# Patient Record
Sex: Male | Born: 1955
Health system: Southern US, Community
[De-identification: ages and names within clinical notes are randomized; demographics above are authoritative.]

## PROBLEM LIST (undated history)

## (undated) DIAGNOSIS — G4733 Obstructive sleep apnea (adult) (pediatric): Secondary | ICD-10-CM

## (undated) DIAGNOSIS — N200 Calculus of kidney: Secondary | ICD-10-CM

## (undated) DIAGNOSIS — Z9289 Personal history of other medical treatment: Secondary | ICD-10-CM

## (undated) DIAGNOSIS — G4730 Sleep apnea, unspecified: Secondary | ICD-10-CM

## (undated) DIAGNOSIS — M1990 Unspecified osteoarthritis, unspecified site: Secondary | ICD-10-CM

## (undated) DIAGNOSIS — C801 Malignant (primary) neoplasm, unspecified: Secondary | ICD-10-CM

## (undated) DIAGNOSIS — K219 Gastro-esophageal reflux disease without esophagitis: Secondary | ICD-10-CM

## (undated) DIAGNOSIS — Z6841 Body Mass Index (BMI) 40.0 and over, adult: Secondary | ICD-10-CM

## (undated) DIAGNOSIS — E785 Hyperlipidemia, unspecified: Secondary | ICD-10-CM

## (undated) DIAGNOSIS — E1169 Type 2 diabetes mellitus with other specified complication: Secondary | ICD-10-CM

## (undated) DIAGNOSIS — I10 Essential (primary) hypertension: Secondary | ICD-10-CM

## (undated) HISTORY — PX: FRACTURE SURGERY: SHX138

## (undated) HISTORY — PX: JOINT REPLACEMENT: SHX530

## (undated) HISTORY — PX: TONSILLECTOMY: SUR1361

## (undated) HISTORY — DX: Morbid (severe) obesity due to excess calories: E66.01

## (undated) HISTORY — DX: Hyperlipidemia, unspecified: E78.5

## (undated) HISTORY — DX: Obstructive sleep apnea (adult) (pediatric): G47.33

## (undated) HISTORY — DX: Type 2 diabetes mellitus with other specified complication: E11.69

## (undated) HISTORY — DX: Body Mass Index (BMI) 40.0 and over, adult: Z684

## (undated) HISTORY — DX: Essential (primary) hypertension: I10

---

## 1998-02-15 ENCOUNTER — Other Ambulatory Visit: Admission: RE | Admit: 1998-02-15 | Discharge: 1998-02-15 | Payer: Self-pay | Admitting: Family Medicine

## 1998-04-18 ENCOUNTER — Other Ambulatory Visit: Admission: RE | Admit: 1998-04-18 | Discharge: 1998-04-18 | Payer: Self-pay | Admitting: Family Medicine

## 2006-09-03 ENCOUNTER — Emergency Department: Payer: Self-pay | Admitting: Emergency Medicine

## 2006-09-04 ENCOUNTER — Ambulatory Visit: Payer: Self-pay | Admitting: Urology

## 2006-09-10 ENCOUNTER — Ambulatory Visit: Payer: Self-pay | Admitting: Urology

## 2006-09-16 ENCOUNTER — Ambulatory Visit: Payer: Self-pay | Admitting: Urology

## 2006-09-29 ENCOUNTER — Ambulatory Visit: Payer: Self-pay

## 2007-01-05 ENCOUNTER — Ambulatory Visit: Payer: Self-pay | Admitting: Urology

## 2008-09-07 ENCOUNTER — Ambulatory Visit (HOSPITAL_COMMUNITY): Admission: RE | Admit: 2008-09-07 | Discharge: 2008-09-07 | Payer: Self-pay | Admitting: Specialist

## 2011-05-26 ENCOUNTER — Emergency Department: Payer: Self-pay | Admitting: *Deleted

## 2011-05-29 ENCOUNTER — Ambulatory Visit: Payer: Self-pay

## 2011-06-06 ENCOUNTER — Inpatient Hospital Stay (HOSPITAL_COMMUNITY): Payer: Worker's Compensation

## 2011-06-06 ENCOUNTER — Inpatient Hospital Stay (HOSPITAL_COMMUNITY)
Admission: RE | Admit: 2011-06-06 | Discharge: 2011-06-08 | DRG: 484 | Disposition: A | Discharge status: Home Health | Payer: Worker's Compensation | Source: Ambulatory Visit | Attending: Orthopedic Surgery | Admitting: Orthopedic Surgery

## 2011-06-06 DIAGNOSIS — R0789 Other chest pain: Secondary | ICD-10-CM | POA: Diagnosis not present

## 2011-06-06 DIAGNOSIS — Z7982 Long term (current) use of aspirin: Secondary | ICD-10-CM

## 2011-06-06 DIAGNOSIS — G4733 Obstructive sleep apnea (adult) (pediatric): Secondary | ICD-10-CM | POA: Diagnosis present

## 2011-06-06 DIAGNOSIS — Z886 Allergy status to analgesic agent status: Secondary | ICD-10-CM

## 2011-06-06 DIAGNOSIS — I10 Essential (primary) hypertension: Secondary | ICD-10-CM | POA: Diagnosis present

## 2011-06-06 DIAGNOSIS — S42209A Unspecified fracture of upper end of unspecified humerus, initial encounter for closed fracture: Principal | ICD-10-CM | POA: Diagnosis present

## 2011-06-06 DIAGNOSIS — Z880 Allergy status to penicillin: Secondary | ICD-10-CM

## 2011-06-06 DIAGNOSIS — E119 Type 2 diabetes mellitus without complications: Secondary | ICD-10-CM | POA: Diagnosis present

## 2011-06-06 LAB — SURGICAL PCR SCREEN
MRSA, PCR: NEGATIVE
Staphylococcus aureus: NEGATIVE

## 2011-06-06 LAB — BASIC METABOLIC PANEL
Calcium: 10.2 mg/dL (ref 8.4–10.5)
Creatinine, Ser: 1.29 mg/dL (ref 0.50–1.35)
Glucose, Bld: 179 mg/dL — ABNORMAL HIGH (ref 70–99)
Potassium: 4.8 mEq/L (ref 3.5–5.1)
Sodium: 134 mEq/L — ABNORMAL LOW (ref 135–145)

## 2011-06-06 LAB — URINALYSIS, ROUTINE W REFLEX MICROSCOPIC
Glucose, UA: 100 mg/dL — AB
Leukocytes, UA: NEGATIVE
Nitrite: NEGATIVE
Urobilinogen, UA: 1 mg/dL (ref 0.0–1.0)

## 2011-06-06 LAB — CBC
HCT: 35.4 % — ABNORMAL LOW (ref 39.0–52.0)
Hemoglobin: 12.3 g/dL — ABNORMAL LOW (ref 13.0–17.0)
MCH: 31.4 pg (ref 26.0–34.0)
MCHC: 34.7 g/dL (ref 30.0–36.0)
Platelets: 349 10*3/uL (ref 150–400)
RDW: 13.6 % (ref 11.5–15.5)
WBC: 6.8 10*3/uL (ref 4.0–10.5)

## 2011-06-06 LAB — GLUCOSE, CAPILLARY
Glucose-Capillary: 181 mg/dL — ABNORMAL HIGH (ref 70–99)
Glucose-Capillary: 184 mg/dL — ABNORMAL HIGH (ref 70–99)
Glucose-Capillary: 238 mg/dL — ABNORMAL HIGH (ref 70–99)

## 2011-06-06 LAB — PROTIME-INR: INR: 1 (ref 0.00–1.49)

## 2011-06-06 LAB — DIFFERENTIAL
Eosinophils Absolute: 0.1 10*3/uL (ref 0.0–0.7)
Lymphocytes Relative: 23 % (ref 12–46)

## 2011-06-07 ENCOUNTER — Inpatient Hospital Stay (HOSPITAL_COMMUNITY): Payer: Worker's Compensation

## 2011-06-07 ENCOUNTER — Encounter (HOSPITAL_COMMUNITY): Payer: Self-pay | Admitting: Radiology

## 2011-06-07 LAB — DIFFERENTIAL
Lymphocytes Relative: 7 % — ABNORMAL LOW (ref 12–46)
Lymphs Abs: 0.7 10*3/uL (ref 0.7–4.0)
Monocytes Relative: 6 % (ref 3–12)
Neutro Abs: 9.2 10*3/uL — ABNORMAL HIGH (ref 1.7–7.7)

## 2011-06-07 LAB — GLUCOSE, CAPILLARY
Glucose-Capillary: 265 mg/dL — ABNORMAL HIGH (ref 70–99)
Glucose-Capillary: 259 mg/dL — ABNORMAL HIGH (ref 70–99)

## 2011-06-07 LAB — COMPREHENSIVE METABOLIC PANEL
ALT: 26 U/L (ref 0–53)
AST: 25 U/L (ref 0–37)
Alkaline Phosphatase: 76 U/L (ref 39–117)
Calcium: 9.3 mg/dL (ref 8.4–10.5)
Chloride: 98 mEq/L (ref 96–112)
Creatinine, Ser: 1.3 mg/dL (ref 0.50–1.35)
GFR calc Af Amer: >60 mL/min (ref >60)
GFR calc non Af Amer: 57 mL/min — ABNORMAL LOW (ref >60)
Glucose, Bld: 248 mg/dL — ABNORMAL HIGH (ref 70–99)
Sodium: 132 mEq/L — ABNORMAL LOW (ref 135–145)

## 2011-06-07 LAB — CBC
Hemoglobin: 11.2 g/dL — ABNORMAL LOW (ref 13.0–17.0)
MCH: 31.3 pg (ref 26.0–34.0)
Platelets: 323 10*3/uL (ref 150–400)
RBC: 3.58 MIL/uL — ABNORMAL LOW (ref 4.22–5.81)
RDW: 13.4 % (ref 11.5–15.5)

## 2011-06-07 LAB — CARDIAC PANEL(CRET KIN+CKTOT+MB+TROPI)
CK, MB: 5.1 ng/mL — ABNORMAL HIGH (ref 0.3–4.0)
CK, MB: 4.1 ng/mL — ABNORMAL HIGH (ref 0.3–4.0)
CK, MB: 3.1 ng/mL (ref 0.3–4.0)
Relative Index: 0.6 (ref 0.0–2.5)
Relative Index: 0.6 (ref 0.0–2.5)
Troponin I: <0.3 ng/mL (ref <0.30)
Troponin I: <0.3 ng/mL (ref <0.30)

## 2011-06-07 LAB — D-DIMER, QUANTITATIVE: D-Dimer, Quant: 3.79 ug/mL-FEU — ABNORMAL HIGH (ref 0.00–0.48)

## 2011-06-07 MED ORDER — IOHEXOL 350 MG/ML SOLN: 100.0000 mL | Freq: Once | INTRAVENOUS | Status: AC | PRN
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
## 2011-07-05 ENCOUNTER — Emergency Department (HOSPITAL_COMMUNITY)
Admission: EM | Admit: 2011-07-05 | Discharge: 2011-07-05 | Disposition: A | Discharge status: Home / Self Care | Payer: Worker's Compensation | Attending: Emergency Medicine | Admitting: Emergency Medicine

## 2011-07-05 ENCOUNTER — Emergency Department (HOSPITAL_COMMUNITY): Payer: Worker's Compensation

## 2011-07-05 DIAGNOSIS — K5900 Constipation, unspecified: Secondary | ICD-10-CM | POA: Insufficient documentation

## 2011-07-05 DIAGNOSIS — R109 Unspecified abdominal pain: Secondary | ICD-10-CM | POA: Insufficient documentation

## 2011-07-05 DIAGNOSIS — R141 Gas pain: Secondary | ICD-10-CM | POA: Insufficient documentation

## 2011-07-05 DIAGNOSIS — R142 Eructation: Secondary | ICD-10-CM | POA: Insufficient documentation

## 2011-07-05 DIAGNOSIS — I10 Essential (primary) hypertension: Secondary | ICD-10-CM | POA: Insufficient documentation

## 2011-07-05 DIAGNOSIS — E119 Type 2 diabetes mellitus without complications: Secondary | ICD-10-CM | POA: Insufficient documentation

## 2011-07-05 DIAGNOSIS — Z79899 Other long term (current) drug therapy: Secondary | ICD-10-CM | POA: Insufficient documentation

## 2011-07-05 DIAGNOSIS — M129 Arthropathy, unspecified: Secondary | ICD-10-CM | POA: Insufficient documentation

## 2011-07-05 DIAGNOSIS — E7800 Pure hypercholesterolemia, unspecified: Secondary | ICD-10-CM | POA: Insufficient documentation

## 2011-07-05 LAB — URINALYSIS, ROUTINE W REFLEX MICROSCOPIC
Bilirubin Urine: NEGATIVE
Glucose, UA: 100 mg/dL — AB
Hgb urine dipstick: NEGATIVE
Ketones, ur: NEGATIVE mg/dL
Protein, ur: NEGATIVE mg/dL
pH: 6.5 (ref 5.0–8.0)

## 2012-03-27 ENCOUNTER — Encounter (HOSPITAL_COMMUNITY): Payer: Self-pay

## 2012-03-27 ENCOUNTER — Encounter (HOSPITAL_COMMUNITY): Payer: Self-pay | Admitting: Pharmacist

## 2012-03-27 NOTE — Progress Notes (Signed)
Multiple R wrist nerve blocks, last one 01/2012

## 2012-03-27 NOTE — Progress Notes (Signed)
[facility]

## 2012-03-27 NOTE — Pre-Procedure Instructions (Signed)
20 [name]  03/27/2012   Your procedure is scheduled on:  04/03/2012  Report to [facility] at 5:30 AM.  Call this number if you have problems the morning of surgery: [phone]   Remember:   Do not eat food:After Midnight.  04/02/2012  May have clear liquids: up to 4 Hours before arrival.  NOTHING AFTER 1:30 a.m.   Clear liquids include soda, tea, black coffee, apple or grape juice, broth.  Take these medicines the morning of surgery with A SIP OF WATER: NEXIUM, NEURONTIN   Do not wear jewelry, make-up or nail polish.  Do not wear lotions, powders, or perfumes. You may wear deodorant.  Do not shave 48 hours prior to surgery. Men may shave face and neck.  Do not bring valuables to the hospital.  Contacts, dentures or bridgework may not be worn into surgery.  Leave suitcase in the car. After surgery it may be brought to your room.  For patients admitted to the hospital, checkout time is 11:00 AM the day of discharge.   Patients discharged the day of surgery will not be allowed to drive home.  Name and phone number of your driver: /w spouse  Special Instructions: CHG Shower Use Special Wash: 1/2 bottle night before surgery and 1/2 bottle morning of surgery.   Please read over the following fact sheets that you were given: Pain Booklet, Coughing and Deep Breathing, MRSA Information and Surgical Site Infection Prevention

## 2012-03-30 ENCOUNTER — Encounter (HOSPITAL_COMMUNITY)
Admission: RE | Admit: 2012-03-30 | Discharge: 2012-03-30 | Disposition: A | Discharge status: Home / Self Care | Payer: Worker's Compensation | Source: Ambulatory Visit | Attending: Orthopedic Surgery | Admitting: Orthopedic Surgery

## 2012-03-30 LAB — BASIC METABOLIC PANEL
BUN: 17 mg/dL (ref 6–23)
Chloride: 101 mEq/L (ref 96–112)
GFR calc Af Amer: 72 mL/min — ABNORMAL LOW (ref >90)
Glucose, Bld: 103 mg/dL — ABNORMAL HIGH (ref 70–99)
Potassium: 3.8 mEq/L (ref 3.5–5.1)
Sodium: 140 mEq/L (ref 135–145)

## 2012-03-30 LAB — CBC
HCT: 39.5 % (ref 39.0–52.0)
Hemoglobin: 13.4 g/dL (ref 13.0–17.0)
MCH: 30.7 pg (ref 26.0–34.0)
MCHC: 33.9 g/dL (ref 30.0–36.0)
RBC: 4.36 MIL/uL (ref 4.22–5.81)

## 2012-03-30 LAB — SURGICAL PCR SCREEN: Staphylococcus aureus: NEGATIVE

## 2012-04-02 MED ORDER — CHLORHEXIDINE GLUCONATE 4 % EX LIQD: 60.0000 mL | Freq: Once | CUTANEOUS | Status: DC

## 2012-04-02 MED ORDER — VANCOMYCIN HCL 1000 MG IV SOLR
1500.0000 mg | INTRAVENOUS | Status: AC
  Administered 2012-04-03: 1500 mg via INTRAVENOUS
  Filled 2012-04-02: qty 1500

## 2012-04-02 NOTE — H&P (Signed)
CC: left shoulder pain and stiffness HPI: 55 y/o male with hx of left proximal humerus fracture requiring a hemi arthroplasty having worsening pain and diminished rom due to scar tissue. Pt has elected for open removal of scar tissue to improve function PMH: diabetes, hypertension, sleep apnea, GERD, kidney stones Social: non smoker, non drinker, no illicit drugs Allergies: norco, penicillin, adhesive Meds: amitriptyline, aspirin, nexium, antara, gabapentin, hctz, insulin, vitamins, zocor, altace, actoplus metformin Ros: limited rom left shoulder s/p fracture PE: alert and appropriate 55 y/o male in no acute distress Cervical spine: full rom, cranial nerves 2-12 intact Left shoulder: moderate restriction in regards to rom nv intact distally Strength 4.5/5 as compared to right with ER and IR Chest: active breath sounds bilaterally with no wheeze rhonchi or rales Heart: regular rate rhythm X-rays: s/p left shoulder hemi arthroplasty in good position and placement Assessment: left shoulder stiffness s/p fracture and hemi arthroplasty Plan: open scar release to increase function 

## 2012-04-03 ENCOUNTER — Inpatient Hospital Stay (HOSPITAL_COMMUNITY)
Admission: RE | Admit: 2012-04-03 | Discharge: 2012-04-04 | DRG: 507 | Disposition: A | Discharge status: Home Health | Payer: Worker's Compensation | Source: Ambulatory Visit | Attending: Orthopedic Surgery | Admitting: Orthopedic Surgery

## 2012-04-03 ENCOUNTER — Encounter (HOSPITAL_COMMUNITY): Payer: Self-pay | Admitting: *Deleted

## 2012-04-03 ENCOUNTER — Encounter (HOSPITAL_COMMUNITY): Payer: Self-pay | Admitting: Anesthesiology

## 2012-04-03 ENCOUNTER — Ambulatory Visit (HOSPITAL_COMMUNITY): Payer: Worker's Compensation | Admitting: Anesthesiology

## 2012-04-03 ENCOUNTER — Encounter (HOSPITAL_COMMUNITY)
Admission: RE | Disposition: A | Discharge status: Home Health | Payer: Self-pay | Source: Ambulatory Visit | Attending: Orthopedic Surgery

## 2012-04-03 DIAGNOSIS — T84099A Other mechanical complication of unspecified internal joint prosthesis, initial encounter: Secondary | ICD-10-CM | POA: Diagnosis present

## 2012-04-03 DIAGNOSIS — E109 Type 1 diabetes mellitus without complications: Secondary | ICD-10-CM | POA: Diagnosis present

## 2012-04-03 DIAGNOSIS — B999 Unspecified infectious disease: Secondary | ICD-10-CM | POA: Diagnosis present

## 2012-04-03 DIAGNOSIS — IMO0002 Reserved for concepts with insufficient information to code with codable children: Secondary | ICD-10-CM | POA: Diagnosis present

## 2012-04-03 DIAGNOSIS — M7500 Adhesive capsulitis of unspecified shoulder: Secondary | ICD-10-CM | POA: Diagnosis present

## 2012-04-03 DIAGNOSIS — Y831 Surgical operation with implant of artificial internal device as the cause of abnormal reaction of the patient, or of later complication, without mention of misadventure at the time of the procedure: Secondary | ICD-10-CM | POA: Diagnosis present

## 2012-04-03 DIAGNOSIS — M25512 Pain in left shoulder: Secondary | ICD-10-CM | POA: Diagnosis present

## 2012-04-03 DIAGNOSIS — Z880 Allergy status to penicillin: Secondary | ICD-10-CM

## 2012-04-03 DIAGNOSIS — K219 Gastro-esophageal reflux disease without esophagitis: Secondary | ICD-10-CM | POA: Diagnosis present

## 2012-04-03 DIAGNOSIS — Z96619 Presence of unspecified artificial shoulder joint: Secondary | ICD-10-CM

## 2012-04-03 DIAGNOSIS — T8450XA Infection and inflammatory reaction due to unspecified internal joint prosthesis, initial encounter: Principal | ICD-10-CM | POA: Diagnosis present

## 2012-04-03 DIAGNOSIS — A498 Other bacterial infections of unspecified site: Secondary | ICD-10-CM | POA: Diagnosis present

## 2012-04-03 DIAGNOSIS — Z794 Long term (current) use of insulin: Secondary | ICD-10-CM

## 2012-04-03 DIAGNOSIS — I10 Essential (primary) hypertension: Secondary | ICD-10-CM | POA: Diagnosis present

## 2012-04-03 DIAGNOSIS — Y92009 Unspecified place in unspecified non-institutional (private) residence as the place of occurrence of the external cause: Secondary | ICD-10-CM

## 2012-04-03 HISTORY — DX: Gastro-esophageal reflux disease without esophagitis: K21.9

## 2012-04-03 HISTORY — DX: Sleep apnea, unspecified: G47.30

## 2012-04-03 HISTORY — DX: Unspecified osteoarthritis, unspecified site: M19.90

## 2012-04-03 HISTORY — DX: Calculus of kidney: N20.0

## 2012-04-03 LAB — GLUCOSE, CAPILLARY
Glucose-Capillary: 166 mg/dL — ABNORMAL HIGH (ref 70–99)
Glucose-Capillary: 133 mg/dL — ABNORMAL HIGH (ref 70–99)
Glucose-Capillary: 189 mg/dL — ABNORMAL HIGH (ref 70–99)

## 2012-04-03 LAB — GRAM STAIN

## 2012-04-03 SURGERY — IRRIGATION AND DEBRIDEMENT SHOULDER
Anesthesia: General | Site: Shoulder | Laterality: Left | Wound class: Dirty or Infected

## 2012-04-03 MED ORDER — PIOGLITAZONE HCL 15 MG PO TABS
15.0000 mg | ORAL_TABLET | Freq: Two times a day (BID) | ORAL | Status: DC
  Administered 2012-04-03 – 2012-04-04 (×2): 15 mg via ORAL
  Filled 2012-04-03 (×4): qty 1

## 2012-04-03 MED ORDER — ACETAMINOPHEN 650 MG RE SUPP: 650.0000 mg | Freq: Four times a day (QID) | RECTAL | Status: DC | PRN

## 2012-04-03 MED ORDER — SODIUM CHLORIDE 0.9 % IR SOLN
Status: DC | PRN
  Administered 2012-04-03: 1000 mL

## 2012-04-03 MED ORDER — DOCUSATE SODIUM 100 MG PO CAPS
100.0000 mg | ORAL_CAPSULE | Freq: Two times a day (BID) | ORAL | Status: DC
  Administered 2012-04-03 – 2012-04-04 (×2): 100 mg via ORAL
  Filled 2012-04-03 (×3): qty 1

## 2012-04-03 MED ORDER — INSULIN DETEMIR 100 UNIT/ML ~~LOC~~ SOLN: 50.0000 [IU] | Freq: Every day | SUBCUTANEOUS | Status: DC

## 2012-04-03 MED ORDER — METFORMIN HCL 850 MG PO TABS
850.0000 mg | ORAL_TABLET | Freq: Two times a day (BID) | ORAL | Status: DC
  Administered 2012-04-03 – 2012-04-04 (×2): 850 mg via ORAL
  Filled 2012-04-03 (×4): qty 1

## 2012-04-03 MED ORDER — SODIUM CHLORIDE 0.9 % IV SOLN
INTRAVENOUS | Status: DC | PRN
  Administered 2012-04-03: 07:00:00 via INTRAVENOUS

## 2012-04-03 MED ORDER — ONDANSETRON HCL 4 MG/2ML IJ SOLN: 4.0000 mg | Freq: Four times a day (QID) | INTRAMUSCULAR | Status: DC | PRN

## 2012-04-03 MED ORDER — ONDANSETRON HCL 4 MG/2ML IJ SOLN
INTRAMUSCULAR | Status: DC | PRN
  Administered 2012-04-03: 4 mg via INTRAVENOUS

## 2012-04-03 MED ORDER — ACETAMINOPHEN 325 MG PO TABS: 650.0000 mg | ORAL_TABLET | Freq: Four times a day (QID) | ORAL | Status: DC | PRN

## 2012-04-03 MED ORDER — HYDROCHLOROTHIAZIDE 12.5 MG PO CAPS
12.5000 mg | ORAL_CAPSULE | Freq: Every day | ORAL | Status: DC
  Administered 2012-04-03 – 2012-04-04 (×2): 12.5 mg via ORAL
  Filled 2012-04-03 (×2): qty 1

## 2012-04-03 MED ORDER — PANTOPRAZOLE SODIUM 40 MG PO TBEC
40.0000 mg | DELAYED_RELEASE_TABLET | Freq: Every day | ORAL | Status: DC
  Administered 2012-04-04: 40 mg via ORAL
  Filled 2012-04-03: qty 1

## 2012-04-03 MED ORDER — OXYCODONE HCL 5 MG PO TABS
5.0000 mg | ORAL_TABLET | ORAL | Status: DC | PRN
  Administered 2012-04-03: 10 mg via ORAL
  Filled 2012-04-03 (×2): qty 2

## 2012-04-03 MED ORDER — KETOROLAC TROMETHAMINE 30 MG/ML IJ SOLN
INTRAMUSCULAR | Status: DC | PRN
  Administered 2012-04-03: 30 mg via INTRAVENOUS

## 2012-04-03 MED ORDER — ROCURONIUM BROMIDE 100 MG/10ML IV SOLN
INTRAVENOUS | Status: DC | PRN
  Administered 2012-04-03: 50 mg via INTRAVENOUS
  Administered 2012-04-03: 10 mg via INTRAVENOUS

## 2012-04-03 MED ORDER — METOCLOPRAMIDE HCL 10 MG PO TABS: 5.0000 mg | ORAL_TABLET | Freq: Three times a day (TID) | ORAL | Status: DC | PRN

## 2012-04-03 MED ORDER — METHOCARBAMOL 100 MG/ML IJ SOLN
500.0000 mg | Freq: Four times a day (QID) | INTRAVENOUS | Status: DC | PRN
  Filled 2012-04-03: qty 5

## 2012-04-03 MED ORDER — ACETAMINOPHEN 10 MG/ML IV SOLN
INTRAVENOUS | Status: AC
  Filled 2012-04-03: qty 100

## 2012-04-03 MED ORDER — METHOCARBAMOL 500 MG PO TABS: 500.0000 mg | ORAL_TABLET | Freq: Four times a day (QID) | ORAL | Status: DC | PRN

## 2012-04-03 MED ORDER — PIOGLITAZONE HCL-METFORMIN HCL 15-850 MG PO TABS
1.0000 | ORAL_TABLET | Freq: Two times a day (BID) | ORAL | Status: DC
Start: 2012-04-03 — End: 2012-04-03

## 2012-04-03 MED ORDER — HYDROMORPHONE HCL PF 1 MG/ML IJ SOLN: 0.2500 mg | INTRAMUSCULAR | Status: DC | PRN

## 2012-04-03 MED ORDER — LACTATED RINGERS IV SOLN
INTRAVENOUS | Status: DC | PRN
  Administered 2012-04-03 (×2): via INTRAVENOUS

## 2012-04-03 MED ORDER — INSULIN DETEMIR 100 UNIT/ML ~~LOC~~ SOLN
50.0000 [IU] | Freq: Every day | SUBCUTANEOUS | Status: DC
  Administered 2012-04-04: 50 [IU] via SUBCUTANEOUS
  Filled 2012-04-03: qty 10

## 2012-04-03 MED ORDER — VITAMIN C 500 MG PO TABS
500.0000 mg | ORAL_TABLET | Freq: Two times a day (BID) | ORAL | Status: DC
  Administered 2012-04-03 – 2012-04-04 (×2): 500 mg via ORAL
  Filled 2012-04-03 (×3): qty 1

## 2012-04-03 MED ORDER — METOCLOPRAMIDE HCL 5 MG/ML IJ SOLN: 5.0000 mg | Freq: Three times a day (TID) | INTRAMUSCULAR | Status: DC | PRN

## 2012-04-03 MED ORDER — PHENOL 1.4 % MT LIQD: 1.0000 | OROMUCOSAL | Status: DC | PRN

## 2012-04-03 MED ORDER — ONDANSETRON HCL 4 MG/2ML IJ SOLN: 4.0000 mg | Freq: Once | INTRAMUSCULAR | Status: DC | PRN

## 2012-04-03 MED ORDER — VANCOMYCIN HCL IN DEXTROSE 1-5 GM/200ML-% IV SOLN
1000.0000 mg | Freq: Two times a day (BID) | INTRAVENOUS | Status: AC
  Administered 2012-04-03: 1000 mg via INTRAVENOUS
  Filled 2012-04-03: qty 200

## 2012-04-03 MED ORDER — BISACODYL 10 MG RE SUPP: 10.0000 mg | Freq: Every day | RECTAL | Status: DC | PRN

## 2012-04-03 MED ORDER — FENTANYL CITRATE 0.05 MG/ML IJ SOLN
INTRAMUSCULAR | Status: DC | PRN
  Administered 2012-04-03: 50 ug via INTRAVENOUS
  Administered 2012-04-03: 100 ug via INTRAVENOUS
  Administered 2012-04-03: 75 ug via INTRAVENOUS

## 2012-04-03 MED ORDER — NEOSTIGMINE METHYLSULFATE 1 MG/ML IJ SOLN
INTRAMUSCULAR | Status: DC | PRN
  Administered 2012-04-03: 5 mg via INTRAVENOUS

## 2012-04-03 MED ORDER — ROPIVACAINE HCL 5 MG/ML IJ SOLN
INTRAMUSCULAR | Status: DC | PRN
  Administered 2012-04-03: 30 mL via EPIDURAL

## 2012-04-03 MED ORDER — PROPOFOL 10 MG/ML IV EMUL
INTRAVENOUS | Status: DC | PRN
  Administered 2012-04-03: 100 mg via INTRAVENOUS
  Administered 2012-04-03: 200 mg via INTRAVENOUS

## 2012-04-03 MED ORDER — ACETAMINOPHEN 10 MG/ML IV SOLN
INTRAVENOUS | Status: DC | PRN
  Administered 2012-04-03: 1000 mg via INTRAVENOUS

## 2012-04-03 MED ORDER — SENNOSIDES-DOCUSATE SODIUM 8.6-50 MG PO TABS
1.0000 | ORAL_TABLET | Freq: Two times a day (BID) | ORAL | Status: DC
Start: 2012-04-03 — End: 2012-04-04
  Administered 2012-04-03 – 2012-04-04 (×2): 1 via ORAL
  Filled 2012-04-03 (×2): qty 1

## 2012-04-03 MED ORDER — FENOFIBRATE 160 MG PO TABS
160.0000 mg | ORAL_TABLET | Freq: Every day | ORAL | Status: DC
  Administered 2012-04-04: 160 mg via ORAL
  Filled 2012-04-03 (×2): qty 1

## 2012-04-03 MED ORDER — MENTHOL 3 MG MT LOZG: 1.0000 | LOZENGE | OROMUCOSAL | Status: DC | PRN

## 2012-04-03 MED ORDER — ONDANSETRON HCL 4 MG PO TABS: 4.0000 mg | ORAL_TABLET | Freq: Four times a day (QID) | ORAL | Status: DC | PRN

## 2012-04-03 MED ORDER — MIDAZOLAM HCL 5 MG/5ML IJ SOLN
INTRAMUSCULAR | Status: DC | PRN
  Administered 2012-04-03: 2 mg via INTRAVENOUS

## 2012-04-03 MED ORDER — RAMIPRIL 10 MG PO CAPS
10.0000 mg | ORAL_CAPSULE | Freq: Every day | ORAL | Status: DC
  Administered 2012-04-04: 10 mg via ORAL
  Filled 2012-04-03 (×2): qty 1

## 2012-04-03 MED ORDER — VITAMIN B-6 100 MG PO TABS
100.0000 mg | ORAL_TABLET | Freq: Two times a day (BID) | ORAL | Status: DC
  Administered 2012-04-03 – 2012-04-04 (×2): 100 mg via ORAL
  Filled 2012-04-03 (×3): qty 1

## 2012-04-03 MED ORDER — HYDROCHLOROTHIAZIDE 25 MG PO TABS: 12.5000 mg | ORAL_TABLET | Freq: Every day | ORAL | Status: DC

## 2012-04-03 MED ORDER — HYDROMORPHONE HCL PF 1 MG/ML IJ SOLN
0.5000 mg | INTRAMUSCULAR | Status: DC | PRN
  Administered 2012-04-04 (×2): 1 mg via INTRAVENOUS
  Filled 2012-04-03 (×2): qty 1

## 2012-04-03 MED ORDER — AMITRIPTYLINE HCL 25 MG PO TABS
25.0000 mg | ORAL_TABLET | Freq: Every day | ORAL | Status: DC
  Administered 2012-04-03: 25 mg via ORAL
  Filled 2012-04-03 (×2): qty 1

## 2012-04-03 MED ORDER — INSULIN ASPART 100 UNIT/ML ~~LOC~~ SOLN
4.0000 [IU] | Freq: Three times a day (TID) | SUBCUTANEOUS | Status: DC
  Administered 2012-04-03 – 2012-04-04 (×3): 4 [IU] via SUBCUTANEOUS

## 2012-04-03 MED ORDER — SIMVASTATIN 40 MG PO TABS
40.0000 mg | ORAL_TABLET | Freq: Every day | ORAL | Status: DC
  Administered 2012-04-03: 40 mg via ORAL
  Filled 2012-04-03 (×2): qty 1

## 2012-04-03 MED ORDER — INSULIN ASPART 100 UNIT/ML ~~LOC~~ SOLN
0.0000 [IU] | Freq: Three times a day (TID) | SUBCUTANEOUS | Status: DC
  Administered 2012-04-03: 2 [IU] via SUBCUTANEOUS
  Administered 2012-04-04: 3 [IU] via SUBCUTANEOUS
  Administered 2012-04-04: 5 [IU] via SUBCUTANEOUS

## 2012-04-03 MED ORDER — LIDOCAINE HCL (CARDIAC) 20 MG/ML IV SOLN
INTRAVENOUS | Status: DC | PRN
  Administered 2012-04-03: 100 mg via INTRAVENOUS

## 2012-04-03 MED ORDER — GLYCOPYRROLATE 0.2 MG/ML IJ SOLN
INTRAMUSCULAR | Status: DC | PRN
  Administered 2012-04-03: .8 mg via INTRAVENOUS

## 2012-04-03 MED ORDER — SODIUM CHLORIDE 0.9 % IV SOLN
INTRAVENOUS | Status: DC
  Administered 2012-04-03 – 2012-04-04 (×2): via INTRAVENOUS

## 2012-04-03 MED ORDER — ASPIRIN EC 81 MG PO TBEC
81.0000 mg | DELAYED_RELEASE_TABLET | Freq: Every day | ORAL | Status: DC
  Administered 2012-04-03 – 2012-04-04 (×2): 81 mg via ORAL
  Filled 2012-04-03 (×2): qty 1

## 2012-04-03 MED ORDER — GABAPENTIN 300 MG PO CAPS
300.0000 mg | ORAL_CAPSULE | Freq: Three times a day (TID) | ORAL | Status: DC
  Administered 2012-04-03 – 2012-04-04 (×3): 300 mg via ORAL
  Filled 2012-04-03 (×5): qty 1

## 2012-04-03 SURGICAL SUPPLY — 48 items
CLOTH BEACON ORANGE TIMEOUT ST (SAFETY) ×2 IMPLANT
CLSR STERI-STRIP ANTIMIC 1/2X4 (GAUZE/BANDAGES/DRESSINGS) ×2 IMPLANT
COVER SURGICAL LIGHT HANDLE (MISCELLANEOUS) ×2 IMPLANT
DRAPE INCISE IOBAN 66X45 STRL (DRAPES) ×2 IMPLANT
DRAPE U-SHAPE 47X51 STRL (DRAPES) ×2 IMPLANT
DRSG PAD ABDOMINAL 8X10 ST (GAUZE/BANDAGES/DRESSINGS) IMPLANT
DURAPREP 26ML APPLICATOR (WOUND CARE) ×2 IMPLANT
DURAPREP 6ML APPLICATOR 50/CS (WOUND CARE) ×2 IMPLANT
ELECT REM PT RETURN 9FT ADLT (ELECTROSURGICAL)
ELECTRODE REM PT RTRN 9FT ADLT (ELECTROSURGICAL) IMPLANT
EVACUATOR 1/8 PVC DRAIN (DRAIN) IMPLANT
GLOVE BIOGEL PI ORTHO PRO 7.5 (GLOVE) ×1
GLOVE BIOGEL PI ORTHO PRO SZ8 (GLOVE) ×1
GLOVE ORTHO TXT STRL SZ7.5 (GLOVE) ×2 IMPLANT
GLOVE PI ORTHO PRO STRL 7.5 (GLOVE) ×1 IMPLANT
GLOVE PI ORTHO PRO STRL SZ8 (GLOVE) ×1 IMPLANT
GLOVE SURG ORTHO 8.0 STRL STRW (GLOVE) ×2 IMPLANT
GLOVE SURG ORTHO 8.5 STRL (GLOVE) ×4 IMPLANT
GOWN STRL NON-REIN LRG LVL3 (GOWN DISPOSABLE) IMPLANT
GOWN STRL REIN XL XLG (GOWN DISPOSABLE) ×6 IMPLANT
HANDPIECE INTERPULSE COAX TIP (DISPOSABLE) ×2
KIT BASIN OR (CUSTOM PROCEDURE TRAY) ×2 IMPLANT
KIT ROOM TURNOVER OR (KITS) ×2 IMPLANT
MANIFOLD NEPTUNE II (INSTRUMENTS) ×2 IMPLANT
NS IRRIG 1000ML POUR BTL (IV SOLUTION) ×2 IMPLANT
PACK SHOULDER (CUSTOM PROCEDURE TRAY) ×2 IMPLANT
PAD ARMBOARD 7.5X6 YLW CONV (MISCELLANEOUS) ×4 IMPLANT
SET HNDPC FAN SPRY TIP SCT (DISPOSABLE) ×1 IMPLANT
SPONGE GAUZE 4X4 12PLY (GAUZE/BANDAGES/DRESSINGS) IMPLANT
SPONGE LAP 18X18 X RAY DECT (DISPOSABLE) IMPLANT
SUT FIBERWIRE #2 38 T-5 BLUE (SUTURE)
SUT MNCRL AB 3-0 PS2 18 (SUTURE) ×2 IMPLANT
SUT PDS AB 1 CT  36 (SUTURE) ×1
SUT PDS AB 1 CT 36 (SUTURE) ×1 IMPLANT
SUT VIC AB 0 CT1 27 (SUTURE)
SUT VIC AB 0 CT1 27XBRD ANBCTR (SUTURE) IMPLANT
SUT VIC AB 2-0 CT1 27 (SUTURE) ×4
SUT VIC AB 2-0 CT1 TAPERPNT 27 (SUTURE) ×2 IMPLANT
SUTURE FIBERWR #2 38 T-5 BLUE (SUTURE) IMPLANT
SWAB COLLECTION DEVICE MRSA (MISCELLANEOUS) ×2 IMPLANT
TAPE PAPER 3X10 WHT MICROPORE (GAUZE/BANDAGES/DRESSINGS) ×2 IMPLANT
TOWEL OR 17X24 6PK STRL BLUE (TOWEL DISPOSABLE) ×2 IMPLANT
TOWEL OR 17X26 10 PK STRL BLUE (TOWEL DISPOSABLE) ×2 IMPLANT
TUBE ANAEROBIC SPECIMEN COL (MISCELLANEOUS) ×2 IMPLANT
TUBE CONNECTING 12X1/4 (SUCTIONS) IMPLANT
UNDERPAD 30X30 INCONTINENT (UNDERPADS AND DIAPERS) IMPLANT
WATER STERILE IRR 1000ML POUR (IV SOLUTION) IMPLANT
YANKAUER SUCT BULB TIP NO VENT (SUCTIONS) IMPLANT

## 2012-04-03 NOTE — Anesthesia Procedure Notes (Addendum)
Anesthesia Regional Block:  Interscalene brachial plexus block  Pre-Anesthetic Checklist: ,, timeout performed, Correct Patient, Correct Site, Correct Laterality, Correct Procedure, Correct Position, site marked, Risks and benefits discussed,  Surgical consent,  Pre-op evaluation,  At surgeon's request and post-op pain management  Laterality: Left  Prep: chloraprep       Needles:  Injection technique: Single-shot  Needle Type: Echogenic Stimulator Needle     Needle Length: 5cm 5 cm     Additional Needles:  Procedures: ultrasound guided and nerve stimulator Interscalene brachial plexus block  Nerve Stimulator or Paresthesia:  Response: 0.4 mA,   Additional Responses:   Narrative:  Start time: 04/03/2012 7:10 AM End time: 04/03/2012 7:25 AM Injection made incrementally with aspirations every 5 mL.  Performed by: Personally  Anesthesiologist: Arta Bruce MD  Additional Notes: Monitors applied. Patient sedated. Sterile prep and drape,hand hygiene and sterile gloves were used. Relevant anatomy identified.Needle position confirmed.Local anesthetic injected incrementally after negative aspiration. Local anesthetic spread visualized around nerve(s). Vascular puncture avoided. No complications. Image printed for medical record.The patient tolerated the procedure well.       Interscalene brachial plexus block Procedure Name: Intubation Date/Time: 04/03/2012 8:06 AM Performed by: Cathie Olden B Pre-anesthesia Checklist: Patient identified, Emergency Drugs available, Suction available, Patient being monitored and Timeout performed Patient Re-evaluated:Patient Re-evaluated prior to inductionOxygen Delivery Method: Circle system utilized Preoxygenation: Pre-oxygenation with 100% oxygen Intubation Type: IV induction Ventilation: Mask ventilation without difficulty Tube type: Oral Tube size: 7.5 mm Number of attempts: 2 Airway Equipment and Method: Stylet and  Video-laryngoscopy Placement Confirmation: ETT inserted through vocal cords under direct vision,  breath sounds checked- equal and bilateral,  positive ETCO2 and CO2 detector Secured at: 23 cm Tube secured with: Tape Dental Injury: Teeth and Oropharynx as per pre-operative assessment  Difficulty Due To: Difficulty was anticipated, Difficult Airway- due to reduced neck mobility and Difficult Airway- due to limited oral opening

## 2012-04-03 NOTE — Anesthesia Postprocedure Evaluation (Signed)
  Anesthesia Post-op Note  Patient: [name]  Procedure(s) Performed: Procedure(s) (LRB): IRRIGATION AND DEBRIDEMENT SHOULDER (Left)  Patient Location: PACU  Anesthesia Type: General  Level of Consciousness: awake  Airway and Oxygen Therapy: Patient Spontanous Breathing  Post-op Pain: mild  Post-op Assessment: Post-op Vital signs reviewed  Post-op Vital Signs: Reviewed  Complications: No apparent anesthesia complications

## 2012-04-03 NOTE — Preoperative (Signed)
Beta Blockers   Reason not to administer Beta Blockers:Not Applicable 

## 2012-04-03 NOTE — Transfer of Care (Signed)
Immediate Anesthesia Transfer of Care Note  Patient: [name]  Procedure(s) Performed: Procedure(s) (LRB): IRRIGATION AND DEBRIDEMENT SHOULDER (Left)  Patient Location: PACU  Anesthesia Type: Houston  Level of Consciousness: awake  Airway & Oxygen Therapy: Patient Spontanous Breathing  Post-op Assessment: Report given to PACU RN and Post -op Vital signs reviewed and stable  Post vital signs: Reviewed and stable  Complications: No apparent anesthesia complications

## 2012-04-03 NOTE — Discharge Instructions (Signed)
Please exercise at least every hour.  Ice shoulder as much as possible. Keep incision clean and dry for 5 days, then shower.  Follow up in 2 weeks.  [phone]

## 2012-04-03 NOTE — Interval H&P Note (Signed)
History and Physical Interval Note:  04/03/2012 7:33 AM  [name]  has presented today for surgery, with the diagnosis of LEFT SHOULDER STIFFNESS, STATUS POST HEMI-ARTROPLASTY  The various methods of treatment have been discussed with the patient and family. After consideration of risks, benefits and other options for treatment, the patient has consented to  Procedure(s) (LRB): IRRIGATION AND DEBRIDEMENT SHOULDER (Left) as a surgical intervention .  The patients' history has been reviewed, patient examined, no change in status, stable for surgery.  I have reviewed the patients' chart and labs.  Questions were answered to the patient's satisfaction.     Jniyah Dantuono,STEVEN R

## 2012-04-03 NOTE — Anesthesia Preprocedure Evaluation (Addendum)
Anesthesia Evaluation  Patient identified by MRN, date of birth, ID band Patient awake    Reviewed: Allergy & Precautions, H&P , NPO status , Patient's Chart, lab work & pertinent test results  Airway Mallampati: III TM Distance: <3 FB Neck ROM: Limited  Mouth opening: Limited Mouth Opening  Dental  (+) Teeth Intact and Dental Advisory Given   Pulmonary shortness of breath and with exertion, sleep apnea and Continuous Positive Airway Pressure Ventilation ,          Cardiovascular hypertension, Pt. on medications     Neuro/Psych    GI/Hepatic GERD-  Controlled and Medicated,  Endo/Other  Diabetes mellitus-, Well Controlled, Type 1, Insulin Dependent  Renal/GU      Musculoskeletal   Abdominal   Peds  Hematology   Anesthesia Other Findings   Reproductive/Obstetrics                         Anesthesia Physical Anesthesia Plan  ASA: III  Anesthesia Plan: General   Post-op Pain Management: MAC Combined w/ Regional for Post-op pain   Induction: Intravenous  Airway Management Planned: Oral ETT  Additional Equipment:   Intra-op Plan:   Post-operative Plan: Extubation in OR  Informed Consent: I have reviewed the patients History and Physical, chart, labs and discussed the procedure including the risks, benefits and alternatives for the proposed anesthesia with the patient or authorized representative who has indicated his/her understanding and acceptance.   Dental advisory given  Plan Discussed with: Surgeon and CRNA  Anesthesia Plan Comments:        Anesthesia Quick Evaluation

## 2012-04-03 NOTE — Brief Op Note (Signed)
04/03/2012  9:46 AM  PATIENT:  [name]  56 y.o. male  PRE-OPERATIVE DIAGNOSIS:  LEFT SHOULDER STIFFNESS, STATUS POST HEMI-ARTHROPLASTY  POST-OPERATIVE DIAGNOSIS:  LEFT SHOULDER STIFFNESS, STATUS POST HEMI-ARTHROPLASTY, POSSIBLE INFECTION, TUBEROSITY MALUNION  PROCEDURE:  Procedure(s) (LRB): IRRIGATION AND DEBRIDEMENT SHOULDER (Left), SCAR TISSUE TAKEDOWN, INTRA-OPERATIVE CULTURES  SURGEON:  Surgeon(s) and Role:    * Verlee Rossetti, MD - Primary  PHYSICIAN ASSISTANT:   ASSISTANTS: Thea Gist, PA-C   ANESTHESIA:   regional and general  EBL:  Total I/O In: 1200 [I.V.:1200] Out: -   BLOOD ADMINISTERED:none  DRAINS: none   LOCAL MEDICATIONS USED:  NONE  SPECIMEN:  Fluid, superficial   Fluid, deep DISPOSITION OF SPECIMEN:  Micro  COUNTS:  YES  TOURNIQUET:  * No tourniquets in log *  DICTATION: .Other Dictation: Dictation Number [id number]  PLAN OF CARE: Admit to inpatient   PATIENT DISPOSITION:  PACU - hemodynamically stable.   Delay start of Pharmacological VTE agent (>24hrs) due to surgical blood loss or risk of bleeding: not applicable

## 2012-04-04 LAB — BASIC METABOLIC PANEL
BUN: 20 mg/dL (ref 6–23)
CO2: 23 mEq/L (ref 19–32)
Chloride: 101 mEq/L (ref 96–112)
Creatinine, Ser: 1.43 mg/dL — ABNORMAL HIGH (ref 0.50–1.35)
Glucose, Bld: 188 mg/dL — ABNORMAL HIGH (ref 70–99)
Potassium: 3.6 mEq/L (ref 3.5–5.1)

## 2012-04-04 LAB — GLUCOSE, CAPILLARY
Glucose-Capillary: 194 mg/dL — ABNORMAL HIGH (ref 70–99)
Glucose-Capillary: 207 mg/dL — ABNORMAL HIGH (ref 70–99)

## 2012-04-04 NOTE — Op Note (Signed)
NAME:  [name], [name] NO.:  [id number]  MEDICAL RECORD NO.:  [id number]  LOCATION:  [facility]                         FACILITY:  [facility]  PHYSICIAN:  [name], M.D. DATE OF BIRTH:  12-28-1955  DATE OF PROCEDURE:  04/03/2012 DATE OF DISCHARGE:                              OPERATIVE REPORT   PREOPERATIVE DIAGNOSIS:  Left shoulder stiffness following hemiarthroplasty for fracture.  POSTOPERATIVE DIAGNOSES: 1. Left shoulder stiffness following hemiarthroplasty for fracture. 2. Left shoulder potential deep infection. 3. Left tuberosity malunion.  PROCEDURE PERFORMED:  Left shoulder exam under anesthesia, open lysis of adhesions, open operative cultures, and open I and D.  SURGEON:  [name], M.D.  ASSISTANT:  [name]. Dixon, PA-C, was scrubbed the entire procedure and necessary for satisfactory completion of surgery.  ANESTHESIA:  General anesthesia was used plus interscalene block.  ESTIMATED BLOOD LOSS:  Minimal.  FLUID REPLACEMENT:  1200 mL crystalloid.  COUNTS:  Correct.  COMPLICATIONS:  No complications.  Perioperative antibiotics were given.  INDICATIONS:  The patient is a 56 year old male with a history of left shoulder fracture from a work-injury.  The patient was treated with initial left shoulder hemiarthroplasty for his fractured shoulder.  The patient went on to successful tuberosity union;  however, the patient's shoulder function never recovered.  He has had persistent pain and limited function with the shoulder.  The patient clinically in the office has extensive scar tissue about the shoulder, which we felt was limiting his ability to use his shoulder and potentially causing his pain.  We discussed options with the patient including continued conservative treatment versus surgery and elected to proceed with surgical management in an attempt to eliminate his pain and restore function of the shoulder, and this was consistent  with open scar release, the patient agreed, consent was signed.  DESCRIPTION OF PROCEDURE:  After adequate level of anesthesia was achieved, the patient was positioned in the modified beach-chair position and exam under anesthesia was performed, revealing a limited range of motion.  He had external rotation limited to 0, internal rotation 30 degrees, not even to his abdomen, forward elevation 45 degrees, abduction about 30 degrees.  We went ahead and then sterilely prepped and draped the shoulder and arm in the usual manner.  Time-out had been called.  We entered the shoulder by using the patient's prior deltopectoral incision, dissection down through subcutaneous tissues. We identified a subcutaneous fluid collection that had cloudy fluid in it, it was about a centimeter round, and we then sent that tissue culture for aerobic, anaerobic, and stat Gram stain.  This came back in Surgery as few monocytes and no organisms.  We next went ahead and irrigated that fully with a liter of pulsatile irrigation.  The remaining tissue looked normal.  We then found the deltopectoral interval and mobilized the deltoid and the pectoralis off the deeper layers.  We identified as we got up near the rotator interval and actually entered in the joint, a little bit more cloudy fluid, which was sent for deep culture that was not back by the time surgery was done. We then went ahead and freed up the subscapularis as best we could  from the undersurface of the coracoid, from the underside of the conjoined tendon.  We were careful to protect the axillary and musculocutaneous nerves.  We freed up the entire subdeltoid plane.  It appeared that the patient had tuberosity malunion with the subscap and lesser tuberosity healing inferiorly and the right greater tuberosity rotator cuff healing posteriorly definitely not in the anatomic position.  The stem was still in proper position, but did not have bone up around  the tuberosity locations where we would like to have it near the pins, but it had healed more posteriorly and down a little bit more inferiorly on the shaft.  We took out all extraneous suture material given the finding of potential pus in the shoulder, so all the FiberWire suture we removed, tuberosities were debulked trying to decrease the bulkiness of those underneath the deltoid.  We then went ahead and made a final assessment. We did some gentle manipulation just being careful not to break the humerus, but tried to get the best motion we could.  We get forward elevation of about 90 degrees, external rotation 45, internal rotation easily to the abdomen, definite improvement, but not a home-run.  I was just concerned about trying to do anymore around the tuberosities for fear of that and destabilizing the rotator cuff or subscap and giving him a poor function.  We thoroughly irrigated with 2 more liters of pulsatile irrigation.  We then closed the deltopectoral interval with #1 PDS suture, followed by 2-0 Vicryl for subcutaneous closure, and 4-0 Monocryl for skin.  Steri-Strips were applied, followed by a sterile dressing.  The patient tolerated the surgery well.     [name], M.D.     SRN/MEDQ  D:  04/03/2012  T:  04/03/2012  Job:  [id number]

## 2012-04-04 NOTE — Progress Notes (Signed)
Subjective: 1 Day Post-Op Procedure(s) (LRB): IRRIGATION AND DEBRIDEMENT SHOULDER (Left) Patient reports pain as 3 on 0-10 scale.    Objective: Vital signs in last 24 hours: Temp:  [96.8 F (36 C)-100.1 F (37.8 C)] 99.3 F (37.4 C) (06/08 0600) Pulse Rate:  [75-110] 95  (06/08 0600) Resp:  [16-34] 18  (06/08 0600) BP: (123-154)/(67-86) 144/82 mmHg (06/08 0600) SpO2:  [91 %-100 %] 91 % (06/08 0600)  Intake/Output from previous day: 06/07 0701 - 06/08 0700 In: 2580 [P.O.:480; I.V.:2100] Out: 150 [Urine:150] Intake/Output this shift:     Basename 04/04/12 0530  HGB 11.5*    Basename 04/04/12 0530  WBC --  RBC --  HCT 34.4*  PLT --    Basename 04/04/12 0530  NA 137  K 3.6  CL 101  CO2 23  BUN 20  CREATININE 1.43*  GLUCOSE 188*  CALCIUM 8.6   No results found for this basename: LABPT:2,INR:2 in the last 72 hours  Neurologically intact Intact pulses distally  Assessment/Plan: 1 Day Post-Op Procedure(s) (LRB): IRRIGATION AND DEBRIDEMENT SHOULDER (Left) D/C IV fluids Discharge home with home health  Juliyah Mergen III,Tyreese Thain L 04/04/2012, 9:52 AM

## 2012-04-04 NOTE — Evaluation (Signed)
Occupational Therapy Evaluation and Discharge  Patient Details Name: [name] MRN: [id number] DOB: 1956/10/02 Today's Date: 04/04/2012 Time: 7425-9563 OT Time Calculation (min): 30 min  OT Assessment / Plan / Recommendation Clinical Impression  Pt. presents s/p IRRIGATION AND DEBRIDEMENT SHOULDER (Left) and with increased pain. All education completed with pt. and pt's wife will provide assist at home. Will defer further OT to [facility] services and allow for D/C home today from therapy perspective.    OT Assessment  All further OT needs can be met in the next venue of care    Follow Up Recommendations  Home health OT;Supervision - Intermittent       Equipment Recommendations  None recommended by OT          Precautions / Restrictions Precautions Precautions: Shoulder Type of Shoulder Precautions: Shoulder ROM to pain tolerance Precaution Booklet Issued: Yes (comment) Restrictions Weight Bearing Restrictions: Yes LUE Weight Bearing: Non weight bearing       ADL  Eating/Feeding: Simulated;Set up Where Assessed - Eating/Feeding: Chair Grooming: Performed;Wash/dry hands;Wash/dry face;Teeth care;Modified independent Where Assessed - Grooming: Unsupported standing Upper Body Bathing: Simulated;Minimal assistance Where Assessed - Upper Body Bathing: Unsupported sitting Lower Body Bathing: Simulated;Minimal assistance Where Assessed - Lower Body Bathing: Unsupported sit to stand Upper Body Dressing: Performed;Moderate assistance (don gown) Where Assessed - Upper Body Dressing: Unsupported sitting Lower Body Dressing: Simulated;Moderate assistance Where Assessed - Lower Body Dressing: Unsupported sit to stand Toilet Transfer: Performed;Modified independent Toilet Transfer Method: Other (comment) (standing) Toilet Transfer Equipment: Regular height toilet Toileting - Clothing Manipulation and Hygiene: Performed;Independent Where Assessed - Toileting Clothing Manipulation and  Hygiene: Standing Tub/Shower Transfer Method: Not assessed Equipment Used: Other (comment) (sling) Transfers/Ambulation Related to ADLs: Pt. mod I ~ 10'  ADL Comments: Pt. recalls exercises and techniques for ADLs from prior surgery. Pt. demonstrated understanding in completing ADL tasks and exercises      OT Problem List: Decreased range of motion;Decreased activity tolerance;Impaired UE functional use     Visit Information  Last OT Received On: 04/04/12 Assistance Needed: +1    Subjective Data  Subjective: "I remember this from last time" Patient Stated Goal: "Go home today"   Prior Functioning  Home Living Lives With: Spouse Available Help at Discharge: Family Type of Home: House Home Access: Level entry Home Layout: One level Bathroom Shower/Tub: Engineer, manufacturing systems: Standard Home Adaptive Equipment: None Prior Function Level of Independence: Independent Able to Take Stairs?: Yes Driving: Yes Vocation: On disability Dominant Hand: Right (ambidexterous)       Extremity/Trunk Assessment Right Upper Extremity Assessment RUE ROM/Strength/Tone: Within functional levels RUE Sensation: WFL - Light Touch RUE Coordination: WFL - gross/fine motor Left Upper Extremity Assessment LUE ROM/Strength/Tone: Deficits;Due to pain;Due to precautions LUE Sensation: WFL - Light Touch LUE Coordination: WFL - fine motor   Mobility Bed Mobility Bed Mobility: Supine to Sit Supine to Sit: 6: Modified independent (Device/Increase time);With rails Details for Bed Mobility Assistance: Increased time Transfers Transfers: Sit to Stand;Stand to Sit Sit to Stand: 6: Modified independent (Device/Increase time);With upper extremity assist;From bed Stand to Sit: 6: Modified independent (Device/Increase time);To chair/3-in-1 Details for Transfer Assistance: Increased time and use of sling         End of Session OT - End of Session Equipment Utilized During Treatment: Gait  belt;Other (comment) (sling) Activity Tolerance: Patient tolerated treatment well Patient left: in chair;with call bell/phone within reach Nurse Communication: Mobility status;Patient requests pain meds   Cassandria Anger, OTR/L Pager [phone] 04/04/2012,  1:30 PM

## 2012-04-04 NOTE — Progress Notes (Signed)
Patient ID: [name], male   DOB: [birth year], 56 y.o.   MRN: [id number] Pt. Discharged 04/04/2012  11:27 AM Discharge instructions reviewed with patient/family. Patient/family verbalized understanding. All Rx's given. Questions answered as needed. Pt. Discharged to home with family/self.  Lurline Idol [facility]

## 2012-04-05 LAB — WOUND CULTURE: Culture: NO GROWTH

## 2012-04-06 LAB — WOUND CULTURE

## 2012-04-07 NOTE — Progress Notes (Signed)
Utilization review completed. Dierre Crevier, RN, BSN. 04/07/12  

## 2012-04-21 ENCOUNTER — Encounter (HOSPITAL_COMMUNITY)
Admission: RE | Admit: 2012-04-21 | Discharge: 2012-04-21 | Disposition: A | Discharge status: Home / Self Care | Payer: Worker's Compensation | Source: Ambulatory Visit | Attending: Orthopedic Surgery | Admitting: Orthopedic Surgery

## 2012-04-21 ENCOUNTER — Encounter (HOSPITAL_COMMUNITY): Payer: Self-pay | Admitting: Pharmacy Technician

## 2012-04-21 ENCOUNTER — Encounter (HOSPITAL_COMMUNITY): Payer: Self-pay

## 2012-04-21 LAB — BASIC METABOLIC PANEL
BUN: 21 mg/dL (ref 6–23)
Creatinine, Ser: 1.23 mg/dL (ref 0.50–1.35)
GFR calc Af Amer: 73 mL/min — ABNORMAL LOW (ref >90)
GFR calc non Af Amer: 63 mL/min — ABNORMAL LOW (ref >90)

## 2012-04-21 LAB — CBC
HCT: 38.5 % — ABNORMAL LOW (ref 39.0–52.0)
MCHC: 34 g/dL (ref 30.0–36.0)
MCV: 91.9 fL (ref 78.0–100.0)
RDW: 14.2 % (ref 11.5–15.5)

## 2012-04-21 NOTE — Pre-Procedure Instructions (Signed)
20 [name]  04/21/2012   Your procedure is scheduled on:  04-24-2012  Report to [facility] at 9:00 AM.  Call this number if you have problems the morning of surgery: [phone]   Remember:   Do not eat food or drink:After Midnight.     Take these medicines the morning of surgery with A SIP OF WATER: Cipro, gabapentin(Neurotin),   Do not wear jewelry, make-up or nail polish.  Do not wear lotions, powders, or perfumes. You may wear deodorant.  Do not shave 48 hours prior to surgery. Men may shave face and neck.  Do not bring valuables to the hospital.  Contacts, dentures or bridgework may not be worn into surgery.  Leave suitcase in the car. After surgery it may be brought to your room.  For patients admitted to the hospital, checkout time is 11:00 AM the day of discharge.      Special Instructions: Incentive Spirometry - Practice and bring it with you on the day of surgery. and CHG Shower Use Special Wash: 1/2 bottle night before surgery and 1/2 bottle morning of surgery.   Please read over the following fact sheets that you were given: Pain Booklet, Coughing and Deep Breathing, MRSA Information and Surgical Site Infection Prevention

## 2012-04-22 NOTE — Consult Note (Addendum)
Anesthesia Chart Review:  Patient is a 56 year old male scheduled for left shoulder removal of implant and reinsert antibiotic spacer by Dr. Ranell Patrick on 04/24/12.  History includes obesity with BMI 35, DM2, HTN, GERD, non-smoker, OSA, arthritis, kidney stones.  He is s/p left shoulder exam under anesthesia with LOA and I&D on 04/03/12 and left shoulder hemiarthroplasty on 06/06/11.  CXR on 06/06/11 showed no active cardiopulmonary disease.  Labs noted.  K 5.1, Cr 1.23, glucose 136.  H/H 13.1/38.5.   EKG on 06/06/11 showed ST, inferior Q waves (new in aVF) from the EKG taken earlier that day.  This was s/p his left shoulder hemiarthroplasty due to complaints of left shoulder and chest pain.  Medicine was consulted and by notes they also reviewed his EKG with Cardiologist Dr. Freida Busman.  A formal Cardiology consult was not felt warranted though as he ruled out for MI by cycled enzymes.  CTA of the chest was also done and ruled out PE.    Although there were some changes in patient's post-operative EKG, he ruled out for MI at that time.  He did not report any chest pain symptoms to his PAT nurse. He has since tolerated another left shoulder procedure (on 04/03/12).  Anticipate he can proceed if he remains asymptomatic.  Anesthesiologist Dr. Chaney Malling agrees with plan.  It has been nearly a year since his previous EKG, so will repeat it on arrival to evaluate for stability.    Shonna Chock, PA-C

## 2012-04-23 MED ORDER — CHLORHEXIDINE GLUCONATE 4 % EX LIQD: 60.0000 mL | Freq: Once | CUTANEOUS | Status: DC

## 2012-04-23 MED ORDER — VANCOMYCIN HCL 1000 MG IV SOLR
1500.0000 mg | INTRAVENOUS | Status: AC
  Administered 2012-04-24: 1.5 g via INTRAVENOUS
  Filled 2012-04-23: qty 1500

## 2012-04-23 NOTE — Discharge Summary (Signed)
Physician Discharge Summary  Patient ID: [name] MRN: [id number] DOB/AGE: 1956/03/19 56 y.o.  Admit date: 04/03/2012 Discharge date: 04/05/2012  Admission Diagnoses:  Left shoulder pain with swelling  Discharge Diagnoses:  E.Coli infection left shoulder   Surgeries: Procedure(s): IRRIGATION AND DEBRIDEMENT SHOULDER on 04/03/2012   Consultants: PT/OT  Discharged Condition: Stable  Hospital Course: [name] is an 56 y.o. male who was admitted 04/03/2012 with a chief complaint of No chief complaint on file. , and found to have a diagnosis of Shoulder pain, left.  They were brought to the operating room on 04/03/2012 and underwent the above named procedures.    The patient had an uncomplicated hospital course and was stable for discharge.  Recent vital signs:  Filed Vitals:   04/04/12 0600  BP: 144/82  Pulse: 95  Temp: 99.3 F (37.4 C)  Resp: 18    Recent laboratory studies:  Results for orders placed during the hospital encounter of 04/03/12  BASIC METABOLIC PANEL      Component Value Range   Sodium 140  135 - 145 mEq/L   Potassium 3.8  3.5 - 5.1 mEq/L   Chloride 101  96 - 112 mEq/L   CO2 23  19 - 32 mEq/L   Glucose, Bld 103 (*) 70 - 99 mg/dL   BUN 17  6 - 23 mg/dL   Creatinine, Ser 5.62  0.50 - 1.35 mg/dL   Calcium 9.9  8.4 - 13.0 mg/dL   GFR calc non Af Amer 62 (*) >90 mL/min   GFR calc Af Amer 72 (*) >90 mL/min  CBC      Component Value Range   WBC 7.4  4.0 - 10.5 K/uL   RBC 4.36  4.22 - 5.81 MIL/uL   Hemoglobin 13.4  13.0 - 17.0 g/dL   HCT 86.5  78.4 - 69.6 %   MCV 90.6  78.0 - 100.0 fL   MCH 30.7  26.0 - 34.0 pg   MCHC 33.9  30.0 - 36.0 g/dL   RDW 29.5  28.4 - 13.2 %   Platelets 251  150 - 400 K/uL  SURGICAL PCR SCREEN      Component Value Range   MRSA, PCR NEGATIVE  NEGATIVE   Staphylococcus aureus NEGATIVE  NEGATIVE  GLUCOSE, CAPILLARY      Component Value Range   Glucose-Capillary 166 (*) 70 - 99 mg/dL  GRAM STAIN      Component Value  Range   Specimen Description WOUND LEFT SHOULDER     Special Requests PATIENT ON FOLLOWING VANCOMYCIN     Gram Stain       Value: RARE WBC PRESENT, PREDOMINANTLY MONONUCLEAR     NO ORGANISMS SEEN     CALLED TO OR 04/03/12 0900 BY K SCHULTZ   Report Status 04/03/2012 FINAL    ANAEROBIC CULTURE      Component Value Range   Specimen Description WOUND LEFT SHOULDER     Special Requests PATIENT ON FOLLOWING VANCOMYCIN     Gram Stain       Value: RARE WBC PRESENT, PREDOMINANTLY MONONUCLEAR     NO ORGANISMS SEEN     Performed at [facility]   Culture NO ANAEROBES ISOLATED     Report Status 04/08/2012 FINAL    WOUND CULTURE      Component Value Range   Specimen Description WOUND LEFT SHOULDER     Special Requests PATIENT ON FOLLOWING VANCOMYCIN     Gram Stain  Value: RARE WBC PRESENT, PREDOMINANTLY MONONUCLEAR     NO ORGANISMS SEEN     Performed at [facility] Gram Stain Report Called to,Read Back By and Verified With: Gram Stain Report Called to,Read Back By and Verified With: OR 04/03/12 0900 BY K SCHULTZ   Culture NO GROWTH 2 DAYS     Report Status 04/05/2012 FINAL    ANAEROBIC CULTURE      Component Value Range   Specimen Description WOUND SHOULDER LEFT     Special Requests LEFT SHOULDER DEEP WOUND     Gram Stain       Value: RARE WBC PRESENT,BOTH PMN AND MONONUCLEAR     NO ORGANISMS SEEN     Performed at [facility]   Culture NO ANAEROBES ISOLATED     Report Status 04/08/2012 FINAL    WOUND CULTURE      Component Value Range   Specimen Description WOUND SHOULDER LEFT     Special Requests LEFT SHOULDER DEEP WOUND     Gram Stain       Value: RARE WBC PRESENT,BOTH PMN AND MONONUCLEAR     NO ORGANISMS SEEN     Gram Stain Report Called to,Read Back By and Verified With: Gram Stain Report Called to,Read Back By and Verified With: OR 04/03/2012 0925 BY K SCHULTZ Performed at [facility]   Culture FEW ESCHERICHIA COLI     Report Status 04/06/2012  FINAL     Organism ID, Bacteria ESCHERICHIA COLI    GRAM STAIN      Component Value Range   Specimen Description WOUND SHOULDER LEFT     Special Requests LEFT SHOULDER DEEP WOUND     Gram Stain       Value: RARE WBC PRESENT,BOTH PMN AND MONONUCLEAR     NO ORGANISMS SEEN     CALLED TO OR 04/03/12 0925 BY K SCHULTZ   Report Status 04/03/2012 FINAL    GLUCOSE, CAPILLARY      Component Value Range   Glucose-Capillary 121 (*) 70 - 99 mg/dL   Comment 1 Documented in Chart     Comment 2 Notify RN    GLUCOSE, CAPILLARY      Component Value Range   Glucose-Capillary 133 (*) 70 - 99 mg/dL   Comment 1 Documented in Chart     Comment 2 Notify RN    GLUCOSE, CAPILLARY      Component Value Range   Glucose-Capillary 107 (*) 70 - 99 mg/dL   Comment 1 Documented in Chart     Comment 2 Notify RN    BASIC METABOLIC PANEL      Component Value Range   Sodium 137  135 - 145 mEq/L   Potassium 3.6  3.5 - 5.1 mEq/L   Chloride 101  96 - 112 mEq/L   CO2 23  19 - 32 mEq/L   Glucose, Bld 188 (*) 70 - 99 mg/dL   BUN 20  6 - 23 mg/dL   Creatinine, Ser 9.60 (*) 0.50 - 1.35 mg/dL   Calcium 8.6  8.4 - 45.4 mg/dL   GFR calc non Af Amer 53 (*) >90 mL/min   GFR calc Af Amer 61 (*) >90 mL/min  HEMOGLOBIN AND HEMATOCRIT, BLOOD      Component Value Range   Hemoglobin 11.5 (*) 13.0 - 17.0 g/dL   HCT 09.8 (*) 11.9 - 14.7 %  GLUCOSE, CAPILLARY      Component Value Range   Glucose-Capillary 189 (*) 70 -  99 mg/dL  GLUCOSE, CAPILLARY      Component Value Range   Glucose-Capillary 194 (*) 70 - 99 mg/dL  GLUCOSE, CAPILLARY      Component Value Range   Glucose-Capillary 207 (*) 70 - 99 mg/dL   Comment 1 Notify RN      Discharge Medications:   Medication List  As of 04/23/2012  7:40 AM   STOP taking these medications         pyridOXINE 100 MG tablet         TAKE these medications         amitriptyline 25 MG tablet   Commonly known as: ELAVIL   Take 25 mg by mouth at bedtime.      aspirin EC 81 MG  tablet   Take 81 mg by mouth daily.      gabapentin 300 MG capsule   Commonly known as: NEURONTIN   Take 300 mg by mouth 3 (three) times daily.      hydrochlorothiazide 25 MG tablet   Commonly known as: HYDRODIURIL   Take 12.5 mg by mouth daily.      LEVEMIR FLEXPEN 100 UNIT/ML injection   Generic drug: insulin detemir   Inject 50 Units into the skin daily with breakfast.      OVER THE COUNTER MEDICATION   Take 2 tablets by mouth 2 (two) times daily. Equate stool softener      pioglitazone-metformin 15-850 MG per tablet   Commonly known as: ACTOPLUS MET   Take 1 tablet by mouth 2 (two) times daily with a meal. Takes one with breakfast and one at dinner      ramipril 10 MG capsule   Commonly known as: ALTACE   Take 10 mg by mouth daily with breakfast.      senna-docusate 8.6-50 MG per tablet   Commonly known as: Senokot-S   Take 1 tablet by mouth 2 (two) times daily.      simvastatin 40 MG tablet   Commonly known as: ZOCOR   Take 40 mg by mouth at bedtime.      vitamin C 500 MG tablet   Commonly known as: ASCORBIC ACID   Take 500 mg by mouth 2 (two) times daily.            Diagnostic Studies: No results found.  Disposition: 01-Home or Self Care    Follow-up Information    Follow up with NORRIS,STEVEN R, MD. Call in 2 weeks. [phone])    Contact information:   [facility] [address] [place] [address] [phone]           Signed: Thea Gist 04/23/2012, 7:40 AM

## 2012-04-24 ENCOUNTER — Inpatient Hospital Stay (HOSPITAL_COMMUNITY)
Admission: RE | Admit: 2012-04-24 | Discharge: 2012-04-27 | DRG: 497 | Disposition: A | Discharge status: Home Health | Payer: Worker's Compensation | Source: Ambulatory Visit | Attending: Orthopedic Surgery | Admitting: Orthopedic Surgery

## 2012-04-24 ENCOUNTER — Ambulatory Visit (HOSPITAL_COMMUNITY): Payer: Worker's Compensation

## 2012-04-24 ENCOUNTER — Encounter (HOSPITAL_COMMUNITY): Payer: Self-pay | Admitting: Anesthesiology

## 2012-04-24 ENCOUNTER — Ambulatory Visit (HOSPITAL_COMMUNITY): Payer: Worker's Compensation | Admitting: Anesthesiology

## 2012-04-24 ENCOUNTER — Other Ambulatory Visit: Payer: Self-pay

## 2012-04-24 ENCOUNTER — Encounter (HOSPITAL_COMMUNITY): Payer: Self-pay | Admitting: *Deleted

## 2012-04-24 ENCOUNTER — Encounter (HOSPITAL_COMMUNITY)
Admission: RE | Disposition: A | Discharge status: Home Health | Payer: Self-pay | Source: Ambulatory Visit | Attending: Orthopedic Surgery

## 2012-04-24 DIAGNOSIS — E119 Type 2 diabetes mellitus without complications: Secondary | ICD-10-CM | POA: Diagnosis present

## 2012-04-24 DIAGNOSIS — Z885 Allergy status to narcotic agent status: Secondary | ICD-10-CM

## 2012-04-24 DIAGNOSIS — A498 Other bacterial infections of unspecified site: Secondary | ICD-10-CM

## 2012-04-24 DIAGNOSIS — T8450XA Infection and inflammatory reaction due to unspecified internal joint prosthesis, initial encounter: Secondary | ICD-10-CM

## 2012-04-24 DIAGNOSIS — Y831 Surgical operation with implant of artificial internal device as the cause of abnormal reaction of the patient, or of later complication, without mention of misadventure at the time of the procedure: Secondary | ICD-10-CM | POA: Diagnosis present

## 2012-04-24 DIAGNOSIS — Z794 Long term (current) use of insulin: Secondary | ICD-10-CM

## 2012-04-24 DIAGNOSIS — Z7982 Long term (current) use of aspirin: Secondary | ICD-10-CM

## 2012-04-24 DIAGNOSIS — G4730 Sleep apnea, unspecified: Secondary | ICD-10-CM | POA: Diagnosis present

## 2012-04-24 DIAGNOSIS — Z01812 Encounter for preprocedural laboratory examination: Secondary | ICD-10-CM

## 2012-04-24 DIAGNOSIS — K219 Gastro-esophageal reflux disease without esophagitis: Secondary | ICD-10-CM | POA: Diagnosis present

## 2012-04-24 DIAGNOSIS — I10 Essential (primary) hypertension: Secondary | ICD-10-CM | POA: Diagnosis present

## 2012-04-24 DIAGNOSIS — Z96619 Presence of unspecified artificial shoulder joint: Secondary | ICD-10-CM

## 2012-04-24 DIAGNOSIS — Y92009 Unspecified place in unspecified non-institutional (private) residence as the place of occurrence of the external cause: Secondary | ICD-10-CM

## 2012-04-24 DIAGNOSIS — Z87442 Personal history of urinary calculi: Secondary | ICD-10-CM

## 2012-04-24 DIAGNOSIS — Z888 Allergy status to other drugs, medicaments and biological substances status: Secondary | ICD-10-CM

## 2012-04-24 DIAGNOSIS — Z880 Allergy status to penicillin: Secondary | ICD-10-CM

## 2012-04-24 LAB — CBC WITH DIFFERENTIAL/PLATELET
Basophils Absolute: 0 10*3/uL (ref 0.0–0.1)
Basophils Relative: 0 % (ref 0–1)
Eosinophils Absolute: 0 10*3/uL (ref 0.0–0.7)
Hemoglobin: 11.4 g/dL — ABNORMAL LOW (ref 13.0–17.0)
MCH: 30.5 pg (ref 26.0–34.0)
MCHC: 34 g/dL (ref 30.0–36.0)
Monocytes Absolute: 0.8 10*3/uL (ref 0.1–1.0)
Neutrophils Relative %: 81 % — ABNORMAL HIGH (ref 43–77)
Platelets: 266 10*3/uL (ref 150–400)
RDW: 14.2 % (ref 11.5–15.5)

## 2012-04-24 LAB — BASIC METABOLIC PANEL
Calcium: 8.8 mg/dL (ref 8.4–10.5)
Creatinine, Ser: 1.26 mg/dL (ref 0.50–1.35)
GFR calc Af Amer: 71 mL/min — ABNORMAL LOW (ref >90)
GFR calc non Af Amer: 61 mL/min — ABNORMAL LOW (ref >90)
Sodium: 137 mEq/L (ref 135–145)

## 2012-04-24 LAB — GRAM STAIN

## 2012-04-24 LAB — SEDIMENTATION RATE: Sed Rate: 20 mm/hr — ABNORMAL HIGH (ref 0–16)

## 2012-04-24 LAB — GLUCOSE, CAPILLARY
Glucose-Capillary: 140 mg/dL — ABNORMAL HIGH (ref 70–99)
Glucose-Capillary: 162 mg/dL — ABNORMAL HIGH (ref 70–99)

## 2012-04-24 SURGERY — REMOVAL, HARDWARE, SHOULDER, WITH IRRIGATION, DEBRIDEMENT, AND INSERTION OF ANTIBIOTIC BEADS OR ANTIBIOTIC SPACER
Anesthesia: Regional | Site: Shoulder | Laterality: Left | Wound class: Dirty or Infected

## 2012-04-24 MED ORDER — VITAMIN C 500 MG PO TABS
500.0000 mg | ORAL_TABLET | Freq: Two times a day (BID) | ORAL | Status: DC
  Administered 2012-04-24 – 2012-04-27 (×6): 500 mg via ORAL
  Filled 2012-04-24 (×8): qty 1

## 2012-04-24 MED ORDER — ACETAMINOPHEN 325 MG PO TABS: 650.0000 mg | ORAL_TABLET | Freq: Four times a day (QID) | ORAL | Status: DC | PRN

## 2012-04-24 MED ORDER — FENTANYL CITRATE 0.05 MG/ML IJ SOLN
50.0000 ug | INTRAMUSCULAR | Status: DC | PRN
  Administered 2012-04-24: 100 ug via INTRAVENOUS

## 2012-04-24 MED ORDER — POTASSIUM CHLORIDE IN NACL 20-0.9 MEQ/L-% IV SOLN
INTRAVENOUS | Status: DC
  Administered 2012-04-25: 11:00:00 via INTRAVENOUS
  Filled 2012-04-24 (×7): qty 1000

## 2012-04-24 MED ORDER — BUPIVACAINE-EPINEPHRINE PF 0.25-1:200000 % IJ SOLN
INTRAMUSCULAR | Status: AC
  Filled 2012-04-24: qty 30

## 2012-04-24 MED ORDER — PNEUMOCOCCAL VAC POLYVALENT 25 MCG/0.5ML IJ INJ
0.5000 mL | INJECTION | INTRAMUSCULAR | Status: AC
  Administered 2012-04-25: 0.5 mL via INTRAMUSCULAR
  Filled 2012-04-24 (×3): qty 0.5

## 2012-04-24 MED ORDER — CIPROFLOXACIN HCL 500 MG PO TABS: 500.0000 mg | ORAL_TABLET | Freq: Two times a day (BID) | ORAL | Status: DC

## 2012-04-24 MED ORDER — FENOFIBRATE 160 MG PO TABS
160.0000 mg | ORAL_TABLET | Freq: Every day | ORAL | Status: DC
  Administered 2012-04-24 – 2012-04-27 (×4): 160 mg via ORAL
  Filled 2012-04-24 (×5): qty 1

## 2012-04-24 MED ORDER — CIPROFLOXACIN HCL 500 MG PO TABS
500.0000 mg | ORAL_TABLET | Freq: Two times a day (BID) | ORAL | Status: DC
  Administered 2012-04-24 – 2012-04-25 (×3): 500 mg via ORAL
  Filled 2012-04-24 (×6): qty 1

## 2012-04-24 MED ORDER — PROPOFOL 10 MG/ML IV EMUL
INTRAVENOUS | Status: DC | PRN
  Administered 2012-04-24: 200 mg via INTRAVENOUS

## 2012-04-24 MED ORDER — HYDROMORPHONE HCL PF 1 MG/ML IJ SOLN: 0.2500 mg | INTRAMUSCULAR | Status: DC | PRN

## 2012-04-24 MED ORDER — BUPIVACAINE-EPINEPHRINE PF 0.5-1:200000 % IJ SOLN
INTRAMUSCULAR | Status: DC | PRN
  Administered 2012-04-24: 30 mL

## 2012-04-24 MED ORDER — OXYCODONE-ACETAMINOPHEN 5-325 MG PO TABS
1.0000 | ORAL_TABLET | ORAL | Status: DC | PRN
Start: 2012-04-24 — End: 2012-04-27
  Administered 2012-04-24 – 2012-04-26 (×4): 2 via ORAL
  Filled 2012-04-24 (×4): qty 2

## 2012-04-24 MED ORDER — INSULIN ASPART 100 UNIT/ML ~~LOC~~ SOLN
4.0000 [IU] | Freq: Three times a day (TID) | SUBCUTANEOUS | Status: DC
  Administered 2012-04-25 – 2012-04-27 (×7): 4 [IU] via SUBCUTANEOUS

## 2012-04-24 MED ORDER — GLYCOPYRROLATE 0.2 MG/ML IJ SOLN
INTRAMUSCULAR | Status: DC | PRN
  Administered 2012-04-24: 0.4 mg via INTRAVENOUS

## 2012-04-24 MED ORDER — ONDANSETRON HCL 4 MG/2ML IJ SOLN
INTRAMUSCULAR | Status: DC | PRN
  Administered 2012-04-24: 4 mg via INTRAVENOUS

## 2012-04-24 MED ORDER — BUPIVACAINE-EPINEPHRINE 0.25% -1:200000 IJ SOLN
INTRAMUSCULAR | Status: DC | PRN
  Administered 2012-04-24: 5 mL

## 2012-04-24 MED ORDER — METOCLOPRAMIDE HCL 5 MG/ML IJ SOLN: 5.0000 mg | Freq: Three times a day (TID) | INTRAMUSCULAR | Status: DC | PRN

## 2012-04-24 MED ORDER — METHOCARBAMOL 500 MG PO TABS
500.0000 mg | ORAL_TABLET | Freq: Four times a day (QID) | ORAL | Status: DC | PRN
  Administered 2012-04-24 – 2012-04-25 (×4): 500 mg via ORAL
  Filled 2012-04-24 (×5): qty 1

## 2012-04-24 MED ORDER — HYDROMORPHONE HCL PF 1 MG/ML IJ SOLN: 0.5000 mg | INTRAMUSCULAR | Status: DC | PRN

## 2012-04-24 MED ORDER — MENTHOL 3 MG MT LOZG: 1.0000 | LOZENGE | OROMUCOSAL | Status: DC | PRN

## 2012-04-24 MED ORDER — DEXTROSE 5 % IV SOLN
500.0000 mg | INTRAVENOUS | Status: DC
  Filled 2012-04-24: qty 5

## 2012-04-24 MED ORDER — AMITRIPTYLINE HCL 25 MG PO TABS
25.0000 mg | ORAL_TABLET | Freq: Every day | ORAL | Status: DC
  Administered 2012-04-24 – 2012-04-26 (×3): 25 mg via ORAL
  Filled 2012-04-24 (×4): qty 1

## 2012-04-24 MED ORDER — ACETAMINOPHEN 650 MG RE SUPP: 650.0000 mg | Freq: Four times a day (QID) | RECTAL | Status: DC | PRN

## 2012-04-24 MED ORDER — ASPIRIN EC 81 MG PO TBEC
81.0000 mg | DELAYED_RELEASE_TABLET | Freq: Every day | ORAL | Status: DC
  Administered 2012-04-24 – 2012-04-27 (×4): 81 mg via ORAL
  Filled 2012-04-24 (×4): qty 1

## 2012-04-24 MED ORDER — METHOCARBAMOL 100 MG/ML IJ SOLN
500.0000 mg | Freq: Four times a day (QID) | INTRAVENOUS | Status: DC | PRN
  Filled 2012-04-24: qty 5

## 2012-04-24 MED ORDER — METFORMIN HCL 850 MG PO TABS
850.0000 mg | ORAL_TABLET | Freq: Two times a day (BID) | ORAL | Status: DC
  Administered 2012-04-24 – 2012-04-27 (×6): 850 mg via ORAL
  Filled 2012-04-24 (×9): qty 1

## 2012-04-24 MED ORDER — ONDANSETRON HCL 4 MG/2ML IJ SOLN: 4.0000 mg | Freq: Four times a day (QID) | INTRAMUSCULAR | Status: DC | PRN

## 2012-04-24 MED ORDER — SIMVASTATIN 40 MG PO TABS
40.0000 mg | ORAL_TABLET | Freq: Every day | ORAL | Status: DC
  Administered 2012-04-24 – 2012-04-26 (×3): 40 mg via ORAL
  Filled 2012-04-24 (×5): qty 1

## 2012-04-24 MED ORDER — PIOGLITAZONE HCL-METFORMIN HCL 15-850 MG PO TABS: 1.0000 | ORAL_TABLET | Freq: Two times a day (BID) | ORAL | Status: DC

## 2012-04-24 MED ORDER — MIDAZOLAM HCL 2 MG/2ML IJ SOLN
1.0000 mg | INTRAMUSCULAR | Status: DC | PRN
  Administered 2012-04-24: 2 mg via INTRAVENOUS

## 2012-04-24 MED ORDER — METHOCARBAMOL 500 MG PO TABS: 500.0000 mg | ORAL_TABLET | Freq: Three times a day (TID) | ORAL | Status: AC | PRN

## 2012-04-24 MED ORDER — LIDOCAINE HCL (CARDIAC) 20 MG/ML IV SOLN
INTRAVENOUS | Status: DC | PRN
  Administered 2012-04-24: 70 mg via INTRAVENOUS

## 2012-04-24 MED ORDER — FENTANYL CITRATE 0.05 MG/ML IJ SOLN
INTRAMUSCULAR | Status: DC | PRN
  Administered 2012-04-24: 100 ug via INTRAVENOUS
  Administered 2012-04-24: 50 ug via INTRAVENOUS

## 2012-04-24 MED ORDER — SENNOSIDES-DOCUSATE SODIUM 8.6-50 MG PO TABS
1.0000 | ORAL_TABLET | Freq: Two times a day (BID) | ORAL | Status: DC
  Administered 2012-04-24 – 2012-04-27 (×6): 1 via ORAL
  Filled 2012-04-24 (×6): qty 1

## 2012-04-24 MED ORDER — INSULIN DETEMIR 100 UNIT/ML ~~LOC~~ SOLN
50.0000 [IU] | Freq: Every day | SUBCUTANEOUS | Status: DC
  Administered 2012-04-25 – 2012-04-27 (×3): 50 [IU] via SUBCUTANEOUS
  Filled 2012-04-24: qty 10

## 2012-04-24 MED ORDER — METOCLOPRAMIDE HCL 10 MG PO TABS: 5.0000 mg | ORAL_TABLET | Freq: Three times a day (TID) | ORAL | Status: DC | PRN

## 2012-04-24 MED ORDER — SODIUM CHLORIDE 0.9 % IR SOLN
Status: DC | PRN
  Administered 2012-04-24: 1000 mL
  Administered 2012-04-24: 3000 mL

## 2012-04-24 MED ORDER — ONDANSETRON HCL 4 MG PO TABS: 4.0000 mg | ORAL_TABLET | Freq: Four times a day (QID) | ORAL | Status: DC | PRN

## 2012-04-24 MED ORDER — EPHEDRINE SULFATE 50 MG/ML IJ SOLN
INTRAMUSCULAR | Status: DC | PRN
  Administered 2012-04-24 (×3): 10 mg via INTRAVENOUS

## 2012-04-24 MED ORDER — HYDROCHLOROTHIAZIDE 12.5 MG PO CAPS
12.5000 mg | ORAL_CAPSULE | Freq: Every day | ORAL | Status: DC
  Administered 2012-04-24 – 2012-04-27 (×4): 12.5 mg via ORAL
  Filled 2012-04-24 (×4): qty 1

## 2012-04-24 MED ORDER — HYDROCHLOROTHIAZIDE 25 MG PO TABS
12.5000 mg | ORAL_TABLET | Freq: Every day | ORAL | Status: DC
  Filled 2012-04-24: qty 0.5

## 2012-04-24 MED ORDER — GABAPENTIN 300 MG PO CAPS
300.0000 mg | ORAL_CAPSULE | Freq: Three times a day (TID) | ORAL | Status: DC
  Administered 2012-04-24 – 2012-04-27 (×8): 300 mg via ORAL
  Filled 2012-04-24 (×12): qty 1

## 2012-04-24 MED ORDER — INSULIN ASPART 100 UNIT/ML ~~LOC~~ SOLN
0.0000 [IU] | Freq: Three times a day (TID) | SUBCUTANEOUS | Status: DC
  Administered 2012-04-24: 8 [IU] via SUBCUTANEOUS
  Administered 2012-04-24: 3 [IU] via SUBCUTANEOUS
  Administered 2012-04-25 (×2): 5 [IU] via SUBCUTANEOUS
  Administered 2012-04-25: 2 [IU] via SUBCUTANEOUS
  Administered 2012-04-26: 3 [IU] via SUBCUTANEOUS
  Administered 2012-04-26 (×2): 5 [IU] via SUBCUTANEOUS
  Administered 2012-04-27: 3 [IU] via SUBCUTANEOUS

## 2012-04-24 MED ORDER — PHENOL 1.4 % MT LIQD: 1.0000 | OROMUCOSAL | Status: DC | PRN

## 2012-04-24 MED ORDER — NEOSTIGMINE METHYLSULFATE 1 MG/ML IJ SOLN
INTRAMUSCULAR | Status: DC | PRN
  Administered 2012-04-24: 3 mg via INTRAVENOUS

## 2012-04-24 MED ORDER — FENTANYL CITRATE 0.05 MG/ML IJ SOLN
INTRAMUSCULAR | Status: AC
  Filled 2012-04-24: qty 2

## 2012-04-24 MED ORDER — OXYCODONE-ACETAMINOPHEN 5-325 MG PO TABS: 1.0000 | ORAL_TABLET | ORAL | Status: AC | PRN

## 2012-04-24 MED ORDER — ROCURONIUM BROMIDE 100 MG/10ML IV SOLN
INTRAVENOUS | Status: DC | PRN
  Administered 2012-04-24: 50 mg via INTRAVENOUS

## 2012-04-24 MED ORDER — RAMIPRIL 10 MG PO CAPS
10.0000 mg | ORAL_CAPSULE | Freq: Every day | ORAL | Status: DC
  Administered 2012-04-25 – 2012-04-27 (×3): 10 mg via ORAL
  Filled 2012-04-24 (×5): qty 1

## 2012-04-24 MED ORDER — MIDAZOLAM HCL 2 MG/2ML IJ SOLN
INTRAMUSCULAR | Status: AC
  Filled 2012-04-24: qty 2

## 2012-04-24 MED ORDER — PIOGLITAZONE HCL 15 MG PO TABS
15.0000 mg | ORAL_TABLET | Freq: Two times a day (BID) | ORAL | Status: DC
  Administered 2012-04-24 – 2012-04-27 (×6): 15 mg via ORAL
  Filled 2012-04-24 (×9): qty 1

## 2012-04-24 MED ORDER — LACTATED RINGERS IV SOLN
INTRAVENOUS | Status: DC | PRN
  Administered 2012-04-24 (×2): via INTRAVENOUS

## 2012-04-24 MED ORDER — SODIUM CHLORIDE 0.9 % IV SOLN
10.0000 mg | INTRAVENOUS | Status: DC | PRN
  Administered 2012-04-24: 10 ug/min via INTRAVENOUS

## 2012-04-24 MED ORDER — LACTATED RINGERS IV SOLN
INTRAVENOUS | Status: DC
  Administered 2012-04-24: 11:00:00 via INTRAVENOUS

## 2012-04-24 MED ORDER — ENOXAPARIN SODIUM 30 MG/0.3ML ~~LOC~~ SOLN
30.0000 mg | Freq: Two times a day (BID) | SUBCUTANEOUS | Status: DC
  Administered 2012-04-25 – 2012-04-27 (×5): 30 mg via SUBCUTANEOUS
  Filled 2012-04-24 (×7): qty 0.3

## 2012-04-24 SURGICAL SUPPLY — 72 items
BLADE SAW SAG 73X25 THK (BLADE)
BLADE SAW SGTL 73X25 THK (BLADE) IMPLANT
BUR SURG 4X8 MED (BURR) IMPLANT
BURR SURG 4X8 MED (BURR)
CLOTH BEACON ORANGE TIMEOUT ST (SAFETY) ×3 IMPLANT
CLSR STERI-STRIP ANTIMIC 1/2X4 (GAUZE/BANDAGES/DRESSINGS) ×3 IMPLANT
COVER SURGICAL LIGHT HANDLE (MISCELLANEOUS) ×3 IMPLANT
DRAPE INCISE IOBAN 66X45 STRL (DRAPES) ×3 IMPLANT
DRAPE U-SHAPE 47X51 STRL (DRAPES) ×3 IMPLANT
DRAPE X-RAY CASS 24X20 (DRAPES) ×3 IMPLANT
DRILL BIT 5/64 (BIT) ×3 IMPLANT
DRSG ADAPTIC 3X8 NADH LF (GAUZE/BANDAGES/DRESSINGS) IMPLANT
DRSG PAD ABDOMINAL 8X10 ST (GAUZE/BANDAGES/DRESSINGS) IMPLANT
DURAPREP 26ML APPLICATOR (WOUND CARE) ×3 IMPLANT
ELECT BLADE 4.0 EZ CLEAN MEGAD (MISCELLANEOUS) ×3
ELECT NEEDLE TIP 2.8 STRL (NEEDLE) ×3 IMPLANT
ELECT REM PT RETURN 9FT ADLT (ELECTROSURGICAL) ×3
ELECTRODE BLDE 4.0 EZ CLN MEGD (MISCELLANEOUS) ×2 IMPLANT
ELECTRODE REM PT RTRN 9FT ADLT (ELECTROSURGICAL) ×2 IMPLANT
GLOVE BIOGEL PI ORTHO PRO 7.5 (GLOVE) ×1
GLOVE BIOGEL PI ORTHO PRO SZ8 (GLOVE) ×2
GLOVE EUDERMIC 7 POWDERFREE (GLOVE) ×3 IMPLANT
GLOVE ORTHO TXT STRL SZ7.5 (GLOVE) ×3 IMPLANT
GLOVE PI ORTHO PRO STRL 7.5 (GLOVE) ×2 IMPLANT
GLOVE PI ORTHO PRO STRL SZ8 (GLOVE) ×4 IMPLANT
GLOVE SURG ORTHO 8.0 STRL STRW (GLOVE) ×6 IMPLANT
GLOVE SURG ORTHO 8.5 STRL (GLOVE) ×3 IMPLANT
GLOVE SURG SS PI 7.0 STRL IVOR (GLOVE) ×6 IMPLANT
GOWN BRE IMP PREV XXLGXLNG (GOWN DISPOSABLE) ×3 IMPLANT
GOWN STRL NON-REIN LRG LVL3 (GOWN DISPOSABLE) IMPLANT
GOWN STRL REIN XL XLG (GOWN DISPOSABLE) ×9 IMPLANT
HANDPIECE INTERPULSE COAX TIP (DISPOSABLE) ×3
KIT BASIN OR (CUSTOM PROCEDURE TRAY) ×3 IMPLANT
KIT ROOM TURNOVER OR (KITS) ×3 IMPLANT
KIT SHOULDER SPACER 7MM STEM (Shoulder) ×3 IMPLANT
MANIFOLD NEPTUNE II (INSTRUMENTS) ×3 IMPLANT
NDL SUT 6 .5 CRC .975X.05 MAYO (NEEDLE) ×2 IMPLANT
NEEDLE 1/2 CIR MAYO (NEEDLE) IMPLANT
NEEDLE HYPO 25GX1X1/2 BEV (NEEDLE) ×3 IMPLANT
NEEDLE MAYO TAPER (NEEDLE) ×2
NS IRRIG 1000ML POUR BTL (IV SOLUTION) ×3 IMPLANT
PACK SHOULDER (CUSTOM PROCEDURE TRAY) ×3 IMPLANT
PAD ARMBOARD 7.5X6 YLW CONV (MISCELLANEOUS) ×6 IMPLANT
SET HNDPC FAN SPRY TIP SCT (DISPOSABLE) ×2 IMPLANT
SLING ARM FOAM STRAP LRG (SOFTGOODS) ×3 IMPLANT
SLING ARM IMMOBILIZER LRG (SOFTGOODS) IMPLANT
SLING ARM IMMOBILIZER MED (SOFTGOODS) IMPLANT
SMARTMIX MINI TOWER (MISCELLANEOUS)
SPONGE GAUZE 4X4 12PLY (GAUZE/BANDAGES/DRESSINGS) IMPLANT
SPONGE LAP 18X18 X RAY DECT (DISPOSABLE) ×3 IMPLANT
SPONGE LAP 4X18 X RAY DECT (DISPOSABLE) ×3 IMPLANT
SPONGE SURGIFOAM ABS GEL SZ50 (HEMOSTASIS) IMPLANT
STRIP CLOSURE SKIN 1/2X4 (GAUZE/BANDAGES/DRESSINGS) IMPLANT
SUCTION FRAZIER TIP 10 FR DISP (SUCTIONS) ×3 IMPLANT
SUT FIBERWIRE #2 38 T-5 BLUE (SUTURE) ×6
SUT MNCRL AB 4-0 PS2 18 (SUTURE) ×3 IMPLANT
SUT PDS AB 1 CT  36 (SUTURE) ×2
SUT PDS AB 1 CT 36 (SUTURE) ×4 IMPLANT
SUT VIC AB 0 CT1 27 (SUTURE) ×2
SUT VIC AB 0 CT1 27XBRD ANBCTR (SUTURE) ×2 IMPLANT
SUT VIC AB 2-0 CT1 27 (SUTURE) ×2
SUT VIC AB 2-0 CT1 TAPERPNT 27 (SUTURE) ×2 IMPLANT
SUT VICRYL AB 2 0 TIES (SUTURE) IMPLANT
SUTURE FIBERWR #2 38 T-5 BLUE (SUTURE) ×4 IMPLANT
SYR CONTROL 10ML LL (SYRINGE) ×3 IMPLANT
TOWEL OR 17X24 6PK STRL BLUE (TOWEL DISPOSABLE) ×3 IMPLANT
TOWEL OR 17X26 10 PK STRL BLUE (TOWEL DISPOSABLE) ×3 IMPLANT
TOWER SMARTMIX MINI (MISCELLANEOUS) IMPLANT
TRAY FOLEY CATH 14FR (SET/KITS/TRAYS/PACK) IMPLANT
TUBE CONNECTING 12X1/4 (SUCTIONS) ×3 IMPLANT
WATER STERILE IRR 1000ML POUR (IV SOLUTION) IMPLANT
YANKAUER SUCT BULB TIP NO VENT (SUCTIONS) ×3 IMPLANT

## 2012-04-24 NOTE — H&P (View-Only) (Signed)
CC: left shoulder pain and stiffness HPI: 56 y/o male with hx of left proximal humerus fracture requiring a hemi arthroplasty having worsening pain and diminished rom due to scar tissue. Pt has elected for open removal of scar tissue to improve function PMH: diabetes, hypertension, sleep apnea, GERD, kidney stones Social: non smoker, non drinker, no illicit drugs Allergies: norco, penicillin, adhesive Meds: amitriptyline, aspirin, nexium, antara, gabapentin, hctz, insulin, vitamins, zocor, altace, actoplus metformin Ros: limited rom left shoulder s/p fracture PE: alert and appropriate 56 y/o male in no acute distress Cervical spine: full rom, cranial nerves 2-12 intact Left shoulder: moderate restriction in regards to rom nv intact distally Strength 4.5/5 as compared to right with ER and IR Chest: active breath sounds bilaterally with no wheeze rhonchi or rales Heart: regular rate rhythm X-rays: s/p left shoulder hemi arthroplasty in good position and placement Assessment: left shoulder stiffness s/p fracture and hemi arthroplasty Plan: open scar release to increase function

## 2012-04-24 NOTE — Anesthesia Preprocedure Evaluation (Addendum)
Anesthesia Evaluation  Patient identified by MRN, date of birth, ID band Patient awake    Reviewed: Allergy & Precautions, H&P , NPO status , Patient's Chart, lab work & pertinent test results  Airway Mallampati: II  Neck ROM: full    Dental  (+) Chipped and Teeth Intact,    Pulmonary sleep apnea ,  breath sounds clear to auscultation        Cardiovascular hypertension,     Neuro/Psych    GI/Hepatic GERD-  ,  Endo/Other  Diabetes mellitus-, Type 2obese  Renal/GU      Musculoskeletal   Abdominal   Peds  Hematology   Anesthesia Other Findings   Reproductive/Obstetrics                          Anesthesia Physical Anesthesia Plan  ASA: III  Anesthesia Plan: General and Regional   Post-op Pain Management: MAC Combined w/ Regional for Post-op pain   Induction: Intravenous  Airway Management Planned: Oral ETT  Additional Equipment:   Intra-op Plan:   Post-operative Plan: Extubation in OR  Informed Consent: I have reviewed the patients History and Physical, chart, labs and discussed the procedure including the risks, benefits and alternatives for the proposed anesthesia with the patient or authorized representative who has indicated his/her understanding and acceptance.     Plan Discussed with: CRNA and Surgeon  Anesthesia Plan Comments:         Anesthesia Quick Evaluation

## 2012-04-24 NOTE — Discharge Instructions (Signed)
Do not try to move or use the left arm.  Keep arm in sling.  Sleep in reclined position, propped up.  Keep incision clean and dry for one week then can shower.

## 2012-04-24 NOTE — Anesthesia Procedure Notes (Addendum)
Anesthesia Regional Block:  Interscalene brachial plexus block  Pre-Anesthetic Checklist: ,, timeout performed, Correct Patient, Correct Site, Correct Laterality, Correct Procedure, Correct Position, site marked, Risks and benefits discussed,  Surgical consent,  Pre-op evaluation,  At surgeon's request and post-op pain management  Laterality: Left  Prep: chloraprep       Needles:  Injection technique: Single-shot  Needle Type: Echogenic Stimulator Needle     Needle Length: 5cm 5 cm Needle Gauge: 22 and 22 G    Additional Needles:  Procedures: ultrasound guided and nerve stimulator Interscalene brachial plexus block  Nerve Stimulator or Paresthesia:  Response: biceps flexion, 0.45 mA,   Additional Responses:   Narrative:  Start time: 04/24/2012 10:21 AM End time: 04/24/2012 10:31 AM Injection made incrementally with aspirations every 5 mL.  Performed by: Personally  Anesthesiologist: Dr Chaney Malling  Additional Notes: Functioning IV was confirmed and monitors were applied.  A 50mm 22ga Arrow echogenic stimulator needle was used. Sterile prep and drape,hand hygiene and sterile gloves were used.  Negative aspiration and negative test dose prior to incremental administration of local anesthetic. The patient tolerated the procedure well.  Ultrasound guidance: relevent anatomy identified, needle position confirmed, local anesthetic spread visualized around nerve(s), vascular puncture avoided.  Image printed for medical record.   Interscalene brachial plexus block Procedure Name: Intubation Date/Time: 04/24/2012 11:28 AM Performed by: Sharlene Dory E Pre-anesthesia Checklist: Patient identified, Emergency Drugs available, Suction available, Patient being monitored and Timeout performed Patient Re-evaluated:Patient Re-evaluated prior to inductionOxygen Delivery Method: Circle system utilized Preoxygenation: Pre-oxygenation with 100% oxygen Intubation Type: IV induction Ventilation:  Mask ventilation without difficulty and Oral airway inserted - appropriate to patient size Laryngoscope Size: Mac and 3 Grade View: Grade IV Tube type: Oral Tube size: 7.5 mm Number of attempts: 2 Airway Equipment and Method: Bougie stylet Placement Confirmation: ETT inserted through vocal cords under direct vision,  positive ETCO2 and breath sounds checked- equal and bilateral Secured at: 22 cm Tube secured with: Tape Dental Injury: Teeth and Oropharynx as per pre-operative assessment and Injury to lip

## 2012-04-24 NOTE — Preoperative (Signed)
Beta Blockers   Reason not to administer Beta Blockers:Not Applicable 

## 2012-04-24 NOTE — Brief Op Note (Signed)
04/24/2012  2:03 PM  PATIENT:  [name]  56 y.o. male  PRE-OPERATIVE DIAGNOSIS:  LEFT SHOULDER INFECTION, prosthesis infection  POST-OPERATIVE DIAGNOSIS:  LEFT SHOULDER INFECTION   PROCEDURE:  Procedure(s) (LRB): EXCISIONAL TOTAL SHOULDER ARTHROPLASTY WITH ANTIBIOTIC SPACER (Left)  SURGEON:  Surgeon(s) and Role:    * Verlee Rossetti, MD - Primary  PHYSICIAN ASSISTANT:   ASSISTANTS: Thea Gist, PA-C   ANESTHESIA:   regional and general  EBL:  Total I/O In: 1000 [I.V.:1000] Out: 250 [Blood:250]  BLOOD ADMINISTERED:none  DRAINS: none   LOCAL MEDICATIONS USED:  MARCAINE     SPECIMEN:  Aspirate  DISPOSITION OF SPECIMEN:  N/A  COUNTS:  YES  TOURNIQUET:  * No tourniquets in log *  DICTATION: .Other Dictation: Dictation Number [id number]  PLAN OF CARE: Admit to inpatient   PATIENT DISPOSITION:  PACU - hemodynamically stable.   Delay start of Pharmacological VTE agent (>24hrs) due to surgical blood loss or risk of bleeding: not applicable
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
## 2012-04-24 NOTE — Progress Notes (Signed)
Report received 

## 2012-04-24 NOTE — Transfer of Care (Signed)
Immediate Anesthesia Transfer of Care Note  Patient: [name]  Procedure(s) Performed: Procedure(s) (LRB): EXCISIONAL TOTAL SHOULDER ARTHROPLASTY WITH ANTIBIOTIC SPACER (Left)  Patient Location: PACU  Anesthesia Type: General  Level of Consciousness: awake, alert  and oriented  Airway & Oxygen Therapy: Patient Spontanous Breathing and Patient connected to face mask oxygen  Post-op Assessment: Report given to PACU RN, Post -op Vital signs reviewed and stable and Patient moving all extremities  Post vital signs: Reviewed and stable  Complications: No apparent anesthesia complications

## 2012-04-24 NOTE — Interval H&P Note (Signed)
History and Physical Interval Note:  04/24/2012 10:29 AM  [name]  has presented today for surgery, with the diagnosis of LEFT SHOULDER INFECTION   The various methods of treatment have been discussed with the patient and family. After consideration of risks, benefits and other options for treatment, the patient has consented to  Procedure(s) (LRB): TOTAL SHOULDER ARTHROPLASTY (Left) as a surgical intervention .  The patient's history has been reviewed, patient examined, no change in status, stable for surgery.  I have reviewed the patients' chart and labs.  Questions were answered to the patient's satisfaction.     [name],STEVEN R

## 2012-04-24 NOTE — Anesthesia Postprocedure Evaluation (Signed)
Anesthesia Post Note  Patient: [name]  Procedure(s) Performed: Procedure(s) (LRB): EXCISIONAL TOTAL SHOULDER ARTHROPLASTY WITH ANTIBIOTIC SPACER (Left)  Anesthesia type: General  Patient location: PACU  Post pain: Pain level controlled and Adequate analgesia  Post assessment: Post-op Vital signs reviewed, Patient's Cardiovascular Status Stable, Respiratory Function Stable, Patent Airway and Pain level controlled  Last Vitals:  Filed Vitals:   04/24/12 1506  BP:   Pulse: 90  Temp:   Resp: 20    Post vital signs: Reviewed and stable  Level of consciousness: awake, alert  and oriented  Complications: No apparent anesthesia complications

## 2012-04-25 LAB — BASIC METABOLIC PANEL
BUN: 14 mg/dL (ref 6–23)
Creatinine, Ser: 1.22 mg/dL (ref 0.50–1.35)
GFR calc Af Amer: 74 mL/min — ABNORMAL LOW (ref >90)
GFR calc non Af Amer: 64 mL/min — ABNORMAL LOW (ref >90)
Potassium: 3.8 mEq/L (ref 3.5–5.1)

## 2012-04-25 LAB — CBC
HCT: 31.4 % — ABNORMAL LOW (ref 39.0–52.0)
MCHC: 33.8 g/dL (ref 30.0–36.0)
Platelets: 276 10*3/uL (ref 150–400)
RDW: 14.6 % (ref 11.5–15.5)

## 2012-04-25 LAB — C-REACTIVE PROTEIN: CRP: 0.81 mg/dL — ABNORMAL HIGH (ref <0.60)

## 2012-04-25 LAB — GENTAMICIN LEVEL, RANDOM: Gentamicin Rm: <0.2 ug/mL

## 2012-04-25 LAB — GLUCOSE, CAPILLARY: Glucose-Capillary: 213 mg/dL — ABNORMAL HIGH (ref 70–99)

## 2012-04-25 MED ORDER — DIPHENHYDRAMINE HCL 50 MG/ML IJ SOLN: 25.0000 mg | Freq: Four times a day (QID) | INTRAMUSCULAR | Status: DC | PRN

## 2012-04-25 MED ORDER — DEXTROSE 5 % IV SOLN
1.0000 g | INTRAVENOUS | Status: DC
  Administered 2012-04-25: 1 g via INTRAVENOUS
  Filled 2012-04-25 (×2): qty 10

## 2012-04-25 NOTE — Op Note (Signed)
[name], [name] NO.:  [id number]  MEDICAL RECORD NO.:  [id number]  LOCATION:  5N06C                        FACILITY:  [facility]  PHYSICIAN:  Almedia Balls. Ranell Patrick, M.D. DATE OF BIRTH:  07/18/1956  DATE OF PROCEDURE:  04/24/2012 DATE OF DISCHARGE:                              OPERATIVE REPORT   PREOPERATIVE DIAGNOSIS:  Left humeral prosthesis infection.  POSTOPERATIVE DIAGNOSIS:  Left humeral prosthesis infection.  PROCEDURE PERFORMED:  Left shoulder hardware and cement removal with placement of antibiotic impregnated shoulder prosthesis by [employer].  ATTENDING SURGEON:  Almedia Balls. Ranell Patrick, M.D.  ASSISTANT:  Donnie Coffin. Dixon, PA-C, was scrubbed during the entire procedure and necessary for satisfactory completion of procedure.  ANESTHESIA:  General anesthesia was used plus interscalene block.  ESTIMATED BLOOD LOSS:  Minimal.  FLUID REPLACEMENT:  Crystalloid 12 mL.  COUNTS:  Correct.  COMPLICATIONS:  No complications.  PERIOPERATIVE ANTIBIOTICS:  Given.  INDICATIONS:  [name] is a 56 year old male who suffered a work injury in which he broke his left shoulder some time ago.  The [name] had underwent a shoulder hemiarthroplasty.  The [name] developed postoperative stiffness.  The [name] was counseled regarding the option for a scar release to improve his range of motion.  Upon surgical scar release, we noted there to be some couple pockets of purulent material in the shoulder, which were sent for culture.  This eventually grew out E. coli.  [name] is back for his hardware removal and placement of antibiotic impregnated spacer as a part of 2-stage revision to likely a reverse shoulder arthroplasty.  The [name] understands the risks and benefits of surgery and would like to proceed.  Informed consent obtained.  DESCRIPTION OF PROCEDURE:  After adequate level of anesthesia achieved, the [name] was positioned in the modified beach-chair position.   Left shoulder was correctly identified, sterilely prepped and draped in usual manner.  Time-out called.  We entered the shoulder through the [name]'s prior deltopectoral incision and identified the interval between the deltoid and the pectoralis.  We developed a blunt dissection, identifying the conjoined tendon.  The humeral component was identified. We freed up the soft tissue around the humerus and released some scar tissue in that area.  We then went ahead and removed the humeral head. We removed more scar tissue and mucinous material from around the stem itself.  We did send more cultures operatively and then went ahead and removed the stem in the following fashion and placed our broach handle on the stem, impacting it distally and then getting it proximally trying to get it out.  We did finally get it out.  We then used various curettes and awls and gadgets to get all the cement out.  We did go ahead and get x-rays and noted at the time of completion of cement removal that there had been a fracture in the humerus, this was likely from rotational forces on the humerus as we were trying to lever out the cement.  We did note there to be significant rotational instability; however, decent longitudinal flexion and abduction stability secondary to muscle attachments.  We irrigated thoroughly the wound, pulse irrigated 3 L of normal saline irrigation.  We  then went ahead and placed our antibiotic spacer in place at 0 degree of version.  We cemented that in position with gentamicin-impregnated cement.  Once that was allowed to set up, we closed the soft tissue over the top with #1 PDS for the muscular layer and deltopectoral, followed by a full- thickness vertical mattress nylon suture for closure of the subcu and skin.  Sterile compressive bandage was applied and shoulder sling and immobilizer.  The [name] tolerated the procedure well.     Almedia Balls. Ranell Patrick, M.D.     SRN/MEDQ  D:   04/24/2012  T:  04/25/2012  Job:  [id number]

## 2012-04-25 NOTE — Progress Notes (Signed)
Orthopedics Progress Note  Subjective: My shoulder is starting to hurt.  Objective:  Filed Vitals:   04/25/12 0553  BP: 122/69  Pulse: 94  Temp: 98.6 F (37 C)  Resp: 19    General: Awake and alert  Musculoskeletal: shoulder dressing is clean and dry, NVI including radial nerve Neurovascularly intact  Lab Results  Component Value Date   WBC 8.6 04/25/2012   HGB 10.6* 04/25/2012   HCT 31.4* 04/25/2012   MCV 89.0 04/25/2012   PLT 276 04/25/2012       Component Value Date/Time   NA 134* 04/25/2012 0900   K 3.8 04/25/2012 0900   CL 97 04/25/2012 0900   CO2 22 04/25/2012 0900   GLUCOSE 221* 04/25/2012 0900   BUN 14 04/25/2012 0900   CREATININE 1.22 04/25/2012 0900   CALCIUM 8.4 04/25/2012 0900   GFRNONAA 64* 04/25/2012 0900   GFRAA 74* 04/25/2012 0900    Lab Results  Component Value Date   INR 1.00 06/06/2011    Assessment/Plan: POD #1 s/p Procedure(s): EXCISIONAL TOTAL SHOULDER ARTHROPLASTY WITH ANTIBIOTIC SPACER Cultures still pending. ID following, thanks.  Likely d/c tomorrow or Monday.  Pain meds and muscle relaxers on chart.  Will need po abx rx if this is added by ID. Gentamycin spacer in place in the shoulder.  No OT for the shoulder.  Almedia Balls. Ranell Patrick, MD 04/25/2012 9:52 AM

## 2012-04-25 NOTE — Progress Notes (Signed)
INFECTIOUS DISEASE PROGRESS NOTE  ID: [name] is a 56 y.o. 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   Subjective: Noticing his shoulder hurting but afebrile  Abtx:  Anti-infectives     Start     Dose/Rate Route Frequency Ordered Stop   04/24/12 2200   ciprofloxacin (CIPRO) tablet 500 mg        500 mg Oral 2 times daily 04/24/12 1556     04/24/12 2000   ciprofloxacin (CIPRO) tablet 500 mg  Status:  Discontinued        500 mg Oral 2 times daily 04/24/12 1556 04/24/12 1605   04/24/12 0600   vancomycin (VANCOCIN) 1,500 mg in sodium chloride 0.9 % 500 mL IVPB        1,500 mg 250 mL/hr over 120 Minutes Intravenous On call 04/23/12 1443 04/24/12 1110          Medications:     . amitriptyline  25 mg Oral QHS  . aspirin EC  81 mg Oral Daily  . ciprofloxacin  500 mg Oral BID  . enoxaparin  30 mg Subcutaneous Q12H  . fenofibrate  160 mg Oral Daily  . gabapentin  300 mg Oral TID  . hydrochlorothiazide  12.5 mg Oral Daily  . insulin aspart  0-15 Units Subcutaneous TID WC  . insulin aspart  4 Units Subcutaneous TID WC  . insulin detemir  50 Units Subcutaneous Q breakfast  . pioglitazone  15 mg Oral BID WC   And  . metFORMIN  850 mg Oral BID WC  . pneumococcal 23 valent vaccine  0.5 mL Intramuscular Tomorrow-1000  . ramipril  10 mg Oral Q breakfast  . senna-docusate  1 tablet Oral BID  . simvastatin  40 mg Oral QHS  . vitamin C  500 mg Oral BID  . DISCONTD: chlorhexidine  60 mL Topical Once  . DISCONTD: ciprofloxacin  500 mg Oral BID  . DISCONTD: hydrochlorothiazide  12.5 mg Oral Daily  . DISCONTD: methocarbamol (ROBAXIN) IV  500 mg Intravenous To PACU  . DISCONTD: pioglitazone-metformin  1 tablet Oral BID WC   Objective: Vital signs in last 24 hours: Temp:  [98.6 F (37 C)-99.3 F (37.4 C)] 98.7 F (37.1 C) (06/29 1457) Pulse Rate:  [94-115] 95  (06/29 1457) Resp:  [18-19] 18  (06/29 1457) BP:  (111-139)/(64-72) 125/64 mmHg (06/29 1457) SpO2:  [96 %-99 %] 98 % (06/29 1457)  Physical Exam  Constitutional: He is oriented to person, place, and time. He appears well-developed and well-nourished. No distress.  HENT:  Mouth/Throat: Oropharynx is clear and moist. No oropharyngeal exudate.  Cardiovascular: Normal rate, regular rhythm and normal heart sounds. Exam reveals no gallop and no friction rub.  No murmur heard.  Pulmonary/Chest: Effort normal and breath sounds normal. No respiratory distress. He has no wheezes.  Abdominal: Soft. Bowel sounds are normal. He exhibits no distension. There is no tenderness.  Lymphadenopathy:  no cervical adenopathy.  Ext= left shoulder in sling. Neurological: He is alert and oriented to person, place, and time.  Skin: Skin is warm and dry. No rash noted. No erythema.     Lab Results  Basename 04/25/12 0900 04/24/12 1758  WBC 8.6 9.0  HGB 10.6* 11.4*  HCT 31.4* 33.5*  NA 134* 137  K 3.8 3.9  CL 97 101  CO2 22 23  BUN 14 18  CREATININE 1.22 1.26  GLU -- --   Sedimentation Rate  Basename 04/24/12 1758  ESRSEDRATE 20*   C-Reactive Protein  Basename 04/24/12 1758  CRP 0.81*    Microbiology: Recent Results (from the past 240 hour(s))  GRAM STAIN     Status: Normal   Collection Time   04/24/12 11:59 AM      Component Value Range Status Comment   Specimen Description WOUND LEFT SHOULDER   Final    Special Requests PATIENT ON FOLLOWING VANCOMYCIN   Final    Gram Stain     Final    Value: FEW WBC PRESENT,BOTH PMN AND MONONUCLEAR     NO ORGANISMS SEEN     CALLED TO OR 04/24/12 1255 BY K SCHULTZ   Report Status 04/24/2012 FINAL   Final   WOUND CULTURE     Status: Normal (Preliminary result)   Collection Time   04/24/12 11:59 AM      Component Value Range Status Comment   Specimen Description WOUND LEFT SHOULDER   Final    Special Requests PATIENT ON FOLLOWING VANCOMYCIN   Final    Gram Stain     Final    Value: FEW WBC  PRESENT,BOTH PMN AND MONONUCLEAR     NO ORGANISMS SEEN     Gram Stain Report Called to,Read Back By and Verified With: Gram Stain Report Called to,Read Back By and Verified With: OR 04/24/2012 1255 BY K SCHULTZ Performed at [facility]   Culture NO GROWTH   Final    Report Status PENDING   Incomplete   ANAEROBIC CULTURE     Status: Normal (Preliminary result)   Collection Time   04/24/12 11:59 AM      Component Value Range Status Comment   Specimen Description WOUND LEFT SHOULDER   Final    Special Requests PATIENT ON FOLLOWING VANCOMYCIN   Final    Gram Stain     Final    Value: FEW WBC PRESENT,BOTH PMN AND MONONUCLEAR     NO ORGANISMS SEEN     Performed at [facility] Gram Stain Report Called to,Read Back By and Verified With: Gram Stain Report Called to,Read Back By and Verified With: OR 04/24/12 1255 BY K SCHULTZ   Culture     Final    Value: NO ANAEROBES ISOLATED; CULTURE IN PROGRESS FOR 5 DAYS   Report Status PENDING   Incomplete     Studies/Results: Dg Humerus Left  04/24/2012  *RADIOLOGY REPORT*  Clinical Data: Intra operative  LEFT HUMERUS - 2+ VIEW  Comparison: 06/06/2011  Findings: Intramedullary fixation device noted across a mid left humeral fracture.  Only a single image is submitted.  The proximal aspect of the left humerus not visualized.  IMPRESSION: Intraoperative imaging as above.  Original Report Authenticated By: Cyndie Chime, M.D.     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  - will do a test dose of ceftriaxone today. Patient reports having amoxicillin for sinus infections in the past without difficulty - if tolerates ceftriaxone 1gm IV, then will we switch cipro to ceftriaxone 2gm IV daily. - he will need picc line for 4 wks of IV therapy.  Olamide Lahaie Infectious Diseases 04/25/2012, 3:27 PM

## 2012-04-26 LAB — GLUCOSE, CAPILLARY: Glucose-Capillary: 233 mg/dL — ABNORMAL HIGH (ref 70–99)

## 2012-04-26 MED ORDER — SODIUM CHLORIDE 0.9 % IJ SOLN: 10.0000 mL | Freq: Two times a day (BID) | INTRAMUSCULAR | Status: DC

## 2012-04-26 MED ORDER — DEXTROSE 5 % IV SOLN: 2.0000 g | INTRAVENOUS | Status: DC

## 2012-04-26 MED ORDER — SODIUM CHLORIDE 0.9 % IJ SOLN
10.0000 mL | INTRAMUSCULAR | Status: DC | PRN
  Administered 2012-04-27: 10 mL

## 2012-04-26 NOTE — Progress Notes (Addendum)
INFECTIOUS DISEASE PROGRESS NOTE  ID: [name] is a 56 y.o. 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   Subjective: Noticing his shoulder hurting but afebrile. Tolerated 1 gm of ceftriaxone went ok. Also had picc line placed without difficulty  Abtx:  Anti-infectives     Start     Dose/Rate Route Frequency Ordered Stop   04/26/12 1600   cefTRIAXone (ROCEPHIN) 2 g in dextrose 5 % 50 mL IVPB  Status:  Discontinued        2 g 100 mL/hr over 30 Minutes Intravenous Every 24 hours 04/26/12 0923 04/26/12 0929   04/25/12 1600   cefTRIAXone (ROCEPHIN) 1 g in dextrose 5 % 50 mL IVPB  Status:  Discontinued        1 g 100 mL/hr over 30 Minutes Intravenous Every 24 hours 04/25/12 1530 04/26/12 0923   04/24/12 2200   ciprofloxacin (CIPRO) tablet 500 mg  Status:  Discontinued        500 mg Oral 2 times daily 04/24/12 1556 04/26/12 0923   04/24/12 2000   ciprofloxacin (CIPRO) tablet 500 mg  Status:  Discontinued        500 mg Oral 2 times daily 04/24/12 1556 04/24/12 1605   04/24/12 0600   vancomycin (VANCOCIN) 1,500 mg in sodium chloride 0.9 % 500 mL IVPB        1,500 mg 250 mL/hr over 120 Minutes Intravenous On call 04/23/12 1443 04/24/12 1110          Medications:     . amitriptyline  25 mg Oral QHS  . aspirin EC  81 mg Oral Daily  . enoxaparin  30 mg Subcutaneous Q12H  . fenofibrate  160 mg Oral Daily  . gabapentin  300 mg Oral TID  . hydrochlorothiazide  12.5 mg Oral Daily  . insulin aspart  0-15 Units Subcutaneous TID WC  . insulin aspart  4 Units Subcutaneous TID WC  . insulin detemir  50 Units Subcutaneous Q breakfast  . pioglitazone  15 mg Oral BID WC   And  . metFORMIN  850 mg Oral BID WC  . pneumococcal 23 valent vaccine  0.5 mL Intramuscular Tomorrow-1000  . ramipril  10 mg Oral Q breakfast  . senna-docusate  1 tablet Oral BID  . simvastatin  40 mg Oral QHS  . vitamin C  500 mg Oral BID  .  DISCONTD: cefTRIAXone (ROCEPHIN)  IV  1 g Intravenous Q24H  . DISCONTD: cefTRIAXone (ROCEPHIN)  IV  2 g Intravenous Q24H  . DISCONTD: ciprofloxacin  500 mg Oral BID   Objective: Vital signs in last 24 hours: Temp:  [98.6 F (37 C)-98.7 F (37.1 C)] 98.6 F (37 C) (06/30 6962) Pulse Rate:  [91-97] 91  (06/30 0614) Resp:  [18-20] 18  (06/30 0614) BP: (121-125)/(64-73) 122/73 mmHg (06/30 0614) SpO2:  [94 %-98 %] 97 % (06/30 9528)  Physical Exam  Constitutional: He is oriented to person, place, and time. He appears well-developed and well-nourished. No distress.  HENT:  Mouth/Throat: Oropharynx is clear and moist. No oropharyngeal exudate.  Cardiovascular: Normal rate, regular rhythm and normal heart sounds. Exam reveals no gallop and no friction rub.  No murmur heard.  Pulmonary/Chest: Effort normal and breath sounds normal. No respiratory distress. He has no wheezes.  Abdominal: Soft. Bowel sounds are normal. He exhibits no distension. There is no tenderness.  Lymphadenopathy:  no cervical adenopathy.  Ext= left shoulder in sling.  Right arm picc line Neurological: He is alert and oriented to person, place, and time.  Skin: Skin is warm and dry. No rash noted. No erythema.     Lab Results  Basename 04/25/12 0900 04/24/12 1758  WBC 8.6 9.0  HGB 10.6* 11.4*  HCT 31.4* 33.5*  NA 134* 137  K 3.8 3.9  CL 97 101  CO2 22 23  BUN 14 18  CREATININE 1.22 1.26  GLU -- --   Sedimentation Rate  Basename 04/24/12 1758  ESRSEDRATE 20*   C-Reactive Protein  Basename 04/24/12 1758  CRP 0.81*    Microbiology: Recent Results (from the past 240 hour(s))  GRAM STAIN     Status: Normal   Collection Time   04/24/12 11:59 AM      Component Value Range Status Comment   Specimen Description WOUND LEFT SHOULDER   Final    Special Requests PATIENT ON FOLLOWING VANCOMYCIN   Final    Gram Stain     Final    Value: FEW WBC PRESENT,BOTH PMN AND MONONUCLEAR     NO ORGANISMS SEEN      CALLED TO OR 04/24/12 1255 BY K SCHULTZ   Report Status 04/24/2012 FINAL   Final   WOUND CULTURE     Status: Normal (Preliminary result)   Collection Time   04/24/12 11:59 AM      Component Value Range Status Comment   Specimen Description WOUND LEFT SHOULDER   Final    Special Requests PATIENT ON FOLLOWING VANCOMYCIN   Final    Gram Stain     Final    Value: FEW WBC PRESENT,BOTH PMN AND MONONUCLEAR     NO ORGANISMS SEEN     Gram Stain Report Called to,Read Back By and Verified With: Gram Stain Report Called to,Read Back By and Verified With: OR 04/24/2012 1255 BY K SCHULTZ Performed [facility]   Culture NO GROWTH 1 DAY   Final    Report Status PENDING   Incomplete   ANAEROBIC CULTURE     Status: Normal (Preliminary result)   Collection Time   04/24/12 11:59 AM      Component Value Range Status Comment   Specimen Description WOUND LEFT SHOULDER   Final    Special Requests PATIENT ON FOLLOWING VANCOMYCIN   Final    Gram Stain     Final    Value: FEW WBC PRESENT,BOTH PMN AND MONONUCLEAR     NO ORGANISMS SEEN     Performed at [facility] Gram Stain Report Called to,Read Back By and Verified With: Gram Stain Report Called to,Read Back By and Verified With: OR 04/24/12 1255 BY K SCHULTZ   Culture     Final    Value: NO ANAEROBES ISOLATED; CULTURE IN PROGRESS FOR 5 DAYS   Report Status PENDING   Incomplete     Studies/Results: Dg Humerus Left  04/24/2012  *RADIOLOGY REPORT*  Clinical Data: Intra operative  LEFT HUMERUS - 2+ VIEW  Comparison: 06/06/2011  Findings: Intramedullary fixation device noted across a mid left humeral fracture.  Only a single image is submitted.  The proximal aspect of the left humerus not visualized.  IMPRESSION: Intraoperative imaging as above.  Original Report Authenticated By: Cyndie Chime, M.D.     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  - will get picc  line today to administer ceftriaxone 2gm IV daily.  - he will need ceftriaxone  2gm IV daily until July 29th.   - he will need home health to teach how to do his antibiotics, and have them do weekly dressing change  - will ask home health to draw cbc, bmp, esr, and crp the last week of antibiotics  - will coordinate with his PCP, dr. Nathanial Rancher, what to do towards end of Iv antibiotics at 4 wks, to convert to orals, since patient lives near [facility].  Will sign off. Call if questions  Rafiel Mecca Infectious Diseases 04/26/2012, 10:04 AM

## 2012-04-26 NOTE — Progress Notes (Signed)
    Subjective: 2 Days Post-Op Procedure(s) (LRB): EXCISIONAL TOTAL SHOULDER ARTHROPLASTY WITH ANTIBIOTIC SPACER (Left) Patient reports pain as 5 on 0-10 scale.   Denies CP or SOB.  Voiding without difficulty. Positive flatus.  Objective: Vital signs in last 24 hours: Temp:  [98.6 F (37 C)-98.7 F (37.1 C)] 98.6 F (37 C) (06/30 1610) Pulse Rate:  [91-97] 91  (06/30 0614) Resp:  [18-20] 18  (06/30 0614) BP: (121-125)/(64-73) 122/73 mmHg (06/30 0614) SpO2:  [94 %-98 %] 97 % (06/30 0614)  Intake/Output from previous day:   Intake/Output this shift:    Labs:  Basename 04/25/12 0900 04/24/12 1758  HGB 10.6* 11.4*    Basename 04/25/12 0900 04/24/12 1758  WBC 8.6 9.0  RBC 3.53* 3.74*  HCT 31.4* 33.5*  PLT 276 266    Basename 04/25/12 0900 04/24/12 1758  NA 134* 137  K 3.8 3.9  CL 97 101  CO2 22 23  BUN 14 18  CREATININE 1.22 1.26  GLUCOSE 221* 181*  CALCIUM 8.4 8.8   No results found for this basename: LABPT:2,INR:2 in the last 72 hours  Physical Exam: Dressings with moderate amount of dry bloody drainage NVI Wiggles fingers Good grip strength   Assessment/Plan: 2 Days Post-Op Procedure(s) (LRB): EXCISIONAL TOTAL SHOULDER ARTHROPLASTY WITH ANTIBIOTIC SPACER (Left) Dressing change Continue as planned PICC line ordered     Gwinda Maine for Dr. Venita Lick [facility] [phone] 04/26/2012, 9:13 AM

## 2012-04-27 LAB — WOUND CULTURE

## 2012-04-27 LAB — GLUCOSE, CAPILLARY
Glucose-Capillary: 112 mg/dL — ABNORMAL HIGH (ref 70–99)
Glucose-Capillary: 151 mg/dL — ABNORMAL HIGH (ref 70–99)

## 2012-04-27 MED ORDER — HEPARIN SOD (PORK) LOCK FLUSH 100 UNIT/ML IV SOLN
250.0000 [IU] | INTRAVENOUS | Status: AC | PRN
  Administered 2012-04-27: 250 [IU]

## 2012-04-27 MED ORDER — DEXTROSE 5 % IV SOLN
2.0000 g | INTRAVENOUS | Status: DC
  Administered 2012-04-27: 2 g via INTRAVENOUS
  Filled 2012-04-27: qty 2

## 2012-04-27 MED ORDER — OXYCODONE-ACETAMINOPHEN 5-325 MG PO TABS: 1.0000 | ORAL_TABLET | ORAL | Status: AC | PRN

## 2012-04-27 MED ORDER — CEFTRIAXONE SODIUM 1 G IJ SOLR: INTRAMUSCULAR | Status: DC

## 2012-04-27 MED ORDER — METHOCARBAMOL 500 MG PO TABS: 500.0000 mg | ORAL_TABLET | Freq: Three times a day (TID) | ORAL | Status: AC | PRN

## 2012-04-27 NOTE — Progress Notes (Signed)
Orthopedics Progress Note  Subjective: Shoulder is hurting today.   Objective:  Filed Vitals:   04/27/12 0847  BP: 149/65  Pulse:   Temp:   Resp:     General: Awake and alert  Musculoskeletal: Left shoulder wound CDI, mod swelling , NVI Neurovascularly intact  Lab Results  Component Value Date   WBC 8.6 04/25/2012   HGB 10.6* 04/25/2012   HCT 31.4* 04/25/2012   MCV 89.0 04/25/2012   PLT 276 04/25/2012       Component Value Date/Time   NA 134* 04/25/2012 0900   K 3.8 04/25/2012 0900   CL 97 04/25/2012 0900   CO2 22 04/25/2012 0900   GLUCOSE 221* 04/25/2012 0900   BUN 14 04/25/2012 0900   CREATININE 1.22 04/25/2012 0900   CALCIUM 8.4 04/25/2012 0900   GFRNONAA 64* 04/25/2012 0900   GFRAA 74* 04/25/2012 0900    Lab Results  Component Value Date   INR 1.00 06/06/2011    Assessment/Plan: POD #3s/p Procedure(s): EXCISIONAL TOTAL SHOULDER ARTHROPLASTY WITH ANTIBIOTIC SPACER Appreciate ID consult.  PLan to D/C home on IV antibiotics per ID recommendation. F/u with me in 2 weeks. No use of the left arm.Marland Kitchenkeep in sling.  Almedia Balls. Ranell Patrick, MD 04/27/2012 12:39 PM

## 2012-04-27 NOTE — Progress Notes (Signed)
CARE MANAGEMENT NOTE 04/27/2012  Comments:  04/27/12 1511 Vance Peper, RN BSN Contacted [facility] to inform them that patient is being discharged to home. Spoke with Harriett Sine, Authorization has been issued by [employer]. Order has been sent to the vendor. Selena Batten is agent responsible. They will contact the patient.

## 2012-04-27 NOTE — Discharge Summary (Signed)
Physician Discharge Summary  Patient ID: [name] MRN: [id number] DOB/AGE: [birth year] 56 y.o.  Admit date: 04/24/2012 Discharge date: 04/27/2012  Admission Diagnoses: left shoulder infection/ in setting of shoulder hemiarthroplasty  Discharge Diagnoses:  same  Discharged Condition: good  Hospital Course: Patient admitted for removal of left shoulder prosthesis due to deep infection with E Coli.  Patient underwent successful surgery on 6/29.  Patient had placement of Gentamicin antibiotic shoulder spacer at surgery. Patient then had infectious disease consult recommending 4 weeks of IV rocephin in addition to the local gentamicin.  Plan d/c with PICC line and routine home health nursing for PICC care and IV antibiotics through July 29th.   Consults: ID  Significant Diagnostic Studies: cultures negative from surgery  Treatments: Open I+D and implant removal and placement of spacer, PICC placement  Discharge Exam: Blood pressure 149/65, pulse 91, temperature 99.4 F (37.4 C), temperature source Oral, resp. rate 18, SpO2 99.00%. Shoulder wound CDI, NVI in the L UE.  Disposition: 06-Home-Health Care Svc  Discharge Orders    Future Appointments: Provider: Department: Dept Phone: Center:   05/25/2012 4:00 PM Judyann Munson, MD [facility] [phone] [facility]     Medication List  As of 04/27/2012 12:32 PM   STOP taking these medications         ciprofloxacin 500 MG tablet         TAKE these medications         amitriptyline 25 MG tablet   Commonly known as: ELAVIL   Take 25 mg by mouth at bedtime.      aspirin EC 81 MG tablet   Take 81 mg by mouth daily.      fenofibrate micronized 134 MG capsule   Commonly known as: LOFIBRA   Take 134 mg by mouth daily before breakfast.      gabapentin 300 MG capsule   Commonly known as: NEURONTIN   Take 300 mg by mouth 3 (three) times daily.      hydrochlorothiazide 25 MG tablet   Commonly known as: HYDRODIURIL   Take  12.5 mg by mouth daily.      LEVEMIR FLEXPEN 100 UNIT/ML injection   Generic drug: insulin detemir   Inject 50 Units into the skin daily with breakfast.      methocarbamol 500 MG tablet   Commonly known as: ROBAXIN   Take 1 tablet (500 mg total) by mouth 3 (three) times daily as needed.      OVER THE COUNTER MEDICATION   Take 2 tablets by mouth 2 (two) times daily. Equate stool softener      oxyCODONE-acetaminophen 5-325 MG per tablet   Commonly known as: PERCOCET   Take 1-2 tablets by mouth every 4 (four) hours as needed for pain.      pioglitazone-metformin 15-850 MG per tablet   Commonly known as: ACTOPLUS MET   Take 1 tablet by mouth 2 (two) times daily with a meal. Takes one with breakfast and one at dinner      ramipril 10 MG capsule   Commonly known as: ALTACE   Take 10 mg by mouth daily with breakfast.      senna-docusate 8.6-50 MG per tablet   Commonly known as: Senokot-S   Take 1 tablet by mouth 2 (two) times daily.      simvastatin 40 MG tablet   Commonly known as: ZOCOR   Take 40 mg by mouth at bedtime.      vitamin C 500 MG  tablet   Commonly known as: ASCORBIC ACID   Take 500 mg by mouth 2 (two) times daily.           Follow-up Information    Follow up with Aleczander Fandino,STEVEN R, MD in 2 weeks. [phone])    Contact information:   [facility] [address] [place] [address] [phone]          Signed: Verlee Rossetti 04/27/2012, 12:32 PM

## 2012-04-27 NOTE — Progress Notes (Signed)
CARE MANAGEMENT NOTE 04/27/2012  Patient:  [name], [name]   Account Number:  [id number]  Date Initiated:  04/27/2012  Documentation initiated by:  Vance Peper  Subjective/Objective Assessment:   56 yr old male s/p left shoulder hardware and cement removal- placement of antibiotic impregnated prosthesis     Action/Plan:   CM spoke with patient and wife regarding need for [facility] for antibiotic infusion. Patient is under worker's comp. RN CM is Lisbeth Renshaw [phone]   Anticipated DC Date:  04/27/2012   Anticipated DC Plan:  HOME W HOME HEALTH SERVICES      DC Planning Services  CM consult      [facility] Choice  NA   Choice offered to / List presented to:          [facility] arranged  HH-1 RN      [facility] agency  OTHER - SEE NOTE   Status of service:  In process, will continue to follow Medicare Important Message given?   (If response is "NO", the following Medicare IM given date fields will be blank) Date Medicare IM given:   Date Additional Medicare IM given:    Discharge Disposition:  HOME W HOME HEALTH SERVICES  Per UR Regulation:    If discussed at Long Length of Stay Meetings, dates discussed:    Comments:  04/27/12 1117 Vance Peper, RN BSN Case Manager [phone] Spoke with Scarlett Presto Case Manager, asked to call [facility] - [phone], fax [phone]. Spoke with Okey Regal, will fax order and prescription.

## 2012-04-27 NOTE — Progress Notes (Signed)
Utilization review completed. Mikkel Charrette, RN, BSN. 

## 2012-04-29 LAB — ANAEROBIC CULTURE

## 2012-05-06 ENCOUNTER — Telehealth: Payer: Self-pay | Admitting: Internal Medicine

## 2012-05-06 NOTE — Telephone Encounter (Signed)
Mr. Losasso was placed on ceftriaxone for an pansensitive e.coli prosthetic joint infection. Unfortunately, the patient has been on therapy for roughly one week and now having a rash.   We will recommend to change back to cipro 750mg  PO BID for remainder of his course of therapy;alternatively can also do bactrim. Also recommended to remove picc line since IV antibiotics not needed.

## 2012-05-25 ENCOUNTER — Inpatient Hospital Stay: Payer: Worker's Compensation | Admitting: Internal Medicine

## 2012-05-29 ENCOUNTER — Encounter (HOSPITAL_COMMUNITY): Payer: Self-pay

## 2012-06-10 ENCOUNTER — Ambulatory Visit (INDEPENDENT_AMBULATORY_CARE_PROVIDER_SITE_OTHER): Payer: Worker's Compensation | Admitting: Internal Medicine

## 2012-06-10 ENCOUNTER — Encounter: Payer: Self-pay | Admitting: Internal Medicine

## 2012-06-10 VITALS — BP 133/86 | HR 80 | Temp 98.4°F | Wt 242.0 lb

## 2012-06-10 DIAGNOSIS — A498 Other bacterial infections of unspecified site: Secondary | ICD-10-CM | POA: Insufficient documentation

## 2012-06-10 DIAGNOSIS — T8450XA Infection and inflammatory reaction due to unspecified internal joint prosthesis, initial encounter: Secondary | ICD-10-CM

## 2012-06-10 LAB — CBC WITH DIFFERENTIAL/PLATELET
Basophils Absolute: 0.1 10*3/uL (ref 0.0–0.1)
Basophils Relative: 1 % (ref 0–1)
Hemoglobin: 13.6 g/dL (ref 13.0–17.0)
MCHC: 34.3 g/dL (ref 30.0–36.0)
Neutro Abs: 2.7 10*3/uL (ref 1.7–7.7)
Neutrophils Relative %: 61 % (ref 43–77)
Platelets: 297 10*3/uL (ref 150–400)
RDW: 14.4 % (ref 11.5–15.5)

## 2012-06-10 LAB — SEDIMENTATION RATE: Sed Rate: 1 mm/hr (ref 0–16)

## 2012-06-10 LAB — C-REACTIVE PROTEIN: CRP: <0.5 mg/dL (ref <0.60)

## 2012-06-10 LAB — BASIC METABOLIC PANEL
Potassium: 4.9 mEq/L (ref 3.5–5.3)
Sodium: 140 mEq/L (ref 135–145)
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
## 2012-06-15 ENCOUNTER — Other Ambulatory Visit: Payer: Self-pay | Admitting: Internal Medicine

## 2012-06-15 ENCOUNTER — Telehealth: Payer: Self-pay | Admitting: *Deleted

## 2012-06-15 DIAGNOSIS — T8450XA Infection and inflammatory reaction due to unspecified internal joint prosthesis, initial encounter: Secondary | ICD-10-CM

## 2012-06-15 MED ORDER — SULFAMETHOXAZOLE-TMP DS 800-160 MG PO TABS: 1.0000 | ORAL_TABLET | Freq: Two times a day (BID) | ORAL | Status: AC

## 2012-06-15 NOTE — Telephone Encounter (Signed)
Pt wanting to know what his results were and whether a prescription has been called in to the [facility] in  [place], [place].  RN shared Sed rate and CRP values with the pt.  Please notify pt when prescription is called in to [facility].

## 2012-08-20 ENCOUNTER — Encounter (HOSPITAL_COMMUNITY): Payer: Self-pay | Admitting: Pharmacy Technician

## 2012-08-26 MED ORDER — BACITRACIN-POLYMYXIN B 500-10000 UNIT/GM OP OINT
TOPICAL_OINTMENT | OPHTHALMIC | Status: AC
  Filled 2012-08-26: qty 3.5

## 2012-08-26 MED ORDER — DEXAMETHASONE SODIUM PHOSPHATE 10 MG/ML IJ SOLN
INTRAMUSCULAR | Status: AC
  Filled 2012-08-26: qty 1

## 2012-08-26 MED ORDER — SODIUM HYALURONATE 10 MG/ML IO SOLN
INTRAOCULAR | Status: AC
  Filled 2012-08-26: qty 0.85

## 2012-08-26 MED ORDER — ACETAZOLAMIDE SODIUM 500 MG IJ SOLR
INTRAMUSCULAR | Status: AC
  Filled 2012-08-26: qty 500

## 2012-08-26 MED ORDER — TRIAMCINOLONE ACETONIDE 40 MG/ML IJ SUSP
INTRAMUSCULAR | Status: AC
  Filled 2012-08-26: qty 1

## 2012-08-26 MED ORDER — EPINEPHRINE HCL 1 MG/ML IJ SOLN
INTRAMUSCULAR | Status: AC
  Filled 2012-08-26: qty 1

## 2012-08-26 MED ORDER — BSS IO SOLN
INTRAOCULAR | Status: AC
  Filled 2012-08-26: qty 500

## 2012-08-26 MED ORDER — LIDOCAINE HCL 2 % IJ SOLN
INTRAMUSCULAR | Status: AC
  Filled 2012-08-26: qty 20

## 2012-08-26 MED ORDER — NA CHONDROIT SULF-NA HYALURON 40-30 MG/ML IO SOLN
INTRAOCULAR | Status: AC
  Filled 2012-08-26: qty 0.5

## 2012-08-26 MED ORDER — HYPROMELLOSE (GONIOSCOPIC) 2.5 % OP SOLN
OPHTHALMIC | Status: AC
  Filled 2012-08-26: qty 15

## 2012-08-26 NOTE — Pre-Procedure Instructions (Signed)
20 SUSANA AGUILLAR  08/26/2012   Your procedure is scheduled on:  Friday, November 8th  Report to [facility] at 0730 AM.  Call this number if you have problems the morning of surgery: [phone]   Remember:   Do not eat food or drink:After Midnight.   Take these medicines the morning of surgery with A SIP OF WATER: neurontin, bactrim   Do not wear jewelry, make-up or nail polish.  Do not wear lotions, powders, or perfumes.   Do not shave 48 hours prior to surgery. Men may shave face and neck.  Do not bring valuables to the hospital.  Contacts, dentures or bridgework may not be worn into surgery.  Leave suitcase in the car. After surgery it may be brought to your room.  For patients admitted to the hospital, checkout time is 11:00 AM the day of discharge.   Patients discharged the day of surgery will not be allowed to drive home.  Special Instructions: Shower using CHG 2 nights before surgery and the night before surgery.  If you shower the day of surgery use CHG.  Use special wash - you have one bottle of CHG for all showers.  You should use approximately 1/3 of the bottle for each shower.   Please read over the following fact sheets that you were given: Pain Booklet, Coughing and Deep Breathing, MRSA Information and Surgical Site Infection Prevention

## 2012-08-27 ENCOUNTER — Encounter (HOSPITAL_COMMUNITY)
Admission: RE | Admit: 2012-08-27 | Discharge: 2012-08-27 | Disposition: A | Discharge status: Home / Self Care | Payer: Worker's Compensation | Source: Ambulatory Visit | Attending: Orthopedic Surgery | Admitting: Orthopedic Surgery

## 2012-08-27 ENCOUNTER — Encounter (HOSPITAL_COMMUNITY): Payer: Self-pay

## 2012-08-27 HISTORY — DX: Malignant (primary) neoplasm, unspecified: C80.1

## 2012-08-27 HISTORY — DX: Personal history of other medical treatment: Z92.89

## 2012-08-27 LAB — CBC
HCT: 41.3 % (ref 39.0–52.0)
Hemoglobin: 14.1 g/dL (ref 13.0–17.0)
RBC: 4.49 MIL/uL (ref 4.22–5.81)
WBC: 4.6 10*3/uL (ref 4.0–10.5)

## 2012-08-27 LAB — TYPE AND SCREEN
ABO/RH(D): O NEG
Antibody Screen: NEGATIVE

## 2012-08-27 LAB — BASIC METABOLIC PANEL
BUN: 24 mg/dL — ABNORMAL HIGH (ref 6–23)
Chloride: 100 mEq/L (ref 96–112)
GFR calc Af Amer: 50 mL/min — ABNORMAL LOW (ref >90)
Glucose, Bld: 160 mg/dL — ABNORMAL HIGH (ref 70–99)
Potassium: 4.3 mEq/L (ref 3.5–5.1)

## 2012-08-27 LAB — ABO/RH: ABO/RH(D): O NEG

## 2012-08-27 NOTE — Progress Notes (Signed)
CC: left shoulder pain  HPI: 56 y/o male with history of infection to left shoulder with current antibiotic space in the left shoulder. Pt has elected for revision reverse total shoulder to decrease pain and increase function PMH: hypertension, hyperlipidemia, diabetes, sleep apnea Social: non smoker, occasional etoh, married Allergies: norco, penicillin, adhesives Meds: actoplus-metformin, HCTZ, aspirin, ramipril, fenofibrate, levemir, gabapentin, septra, atorvastatin Family: hypertension, coronary heart disease, CVA, cancer ROS: pain and limited rom with left shoulder PE: alert and appropriate 56 y/o male in no acute distress Cervical spine with full rom, cranial nerves 2-12 intact Left shoulder: limited rom with antibiotic spacer in place left shoulder nv intact distally No rashes or edema Chest; active breath sounds  X-rays: s/p antibiotic spacer to left shoulder with healed mid shaft humerus fracture Assessment: healed humerus fracture with left shoulder antibiotic spacer Plan; revision reverse total shoulder to decrease pain and increase function

## 2012-08-28 NOTE — Consult Note (Signed)
Anesthesia chart review: Patient is a 56 year old male scheduled for left shoulder revision of reverse total shoulder arthroplasty including allograft for humeral and glenoid component by Dr. Ranell Patrick on 09/04/2012.    History includes obesity with BMI 35, DM2, HTN, GERD, non-smoker, OSA, arthritis, kidney stones. He is s/p left shoulder exam under anesthesia with LOA and I&D on 04/03/12, left shoulder hemiarthroplasty on 06/06/11, and removal of left shoulder implant with antibiotic spacer on 04/24/12.  PCP is Dr. Burnell Blanks.   EKG on 04/24/12 showed NSR.   CXR on 08/27/2012 showed no acute cardiopulmonary process.    Labs noted. K 4.3, BUN 24, Cr 1.69 (up from 1.49 on 06/10/12), glucose 160. H/H 14.1/41.3.  I called and spoke with Mr. Toran.  He reports that Dr. Nathanial Rancher has him get labs every 3-6 months, last approximately one month ago.  She told him that his kidney function tests were a little elevated then.  She recently decreased his Actoplus Met frequencly from TID to BID.  Labs routed to Dr. Nathanial Rancher with patient's verbal consent.  Patient is aware of the importance of continued follow-up with his PCP regarding renal insufficiency with underlying diabetes and hypertension.    If no significant change in his status then anticipate he can proceed as planned.  Shonna Chock, PA-C  08/28/12 1552

## 2012-09-03 MED ORDER — VANCOMYCIN HCL 1000 MG IV SOLR
1500.0000 mg | INTRAVENOUS | Status: AC
  Administered 2012-09-04: 1500 mg via INTRAVENOUS
  Filled 2012-09-03: qty 1500

## 2012-09-04 ENCOUNTER — Inpatient Hospital Stay (HOSPITAL_COMMUNITY)
Admission: RE | Admit: 2012-09-04 | Discharge: 2012-09-06 | DRG: 517 | Disposition: A | Discharge status: Home / Self Care | Payer: Worker's Compensation | Source: Ambulatory Visit | Attending: Orthopedic Surgery | Admitting: Orthopedic Surgery

## 2012-09-04 ENCOUNTER — Encounter (HOSPITAL_COMMUNITY)
Admission: RE | Disposition: A | Discharge status: Home / Self Care | Payer: Self-pay | Source: Ambulatory Visit | Attending: Orthopedic Surgery

## 2012-09-04 ENCOUNTER — Encounter (HOSPITAL_COMMUNITY): Payer: Self-pay | Admitting: *Deleted

## 2012-09-04 ENCOUNTER — Ambulatory Visit (HOSPITAL_COMMUNITY): Payer: Worker's Compensation | Admitting: Vascular Surgery

## 2012-09-04 ENCOUNTER — Encounter (HOSPITAL_COMMUNITY): Payer: Self-pay | Admitting: Vascular Surgery

## 2012-09-04 ENCOUNTER — Inpatient Hospital Stay (HOSPITAL_COMMUNITY): Payer: Worker's Compensation

## 2012-09-04 DIAGNOSIS — Z01818 Encounter for other preprocedural examination: Secondary | ICD-10-CM

## 2012-09-04 DIAGNOSIS — Z79899 Other long term (current) drug therapy: Secondary | ICD-10-CM

## 2012-09-04 DIAGNOSIS — Z01812 Encounter for preprocedural laboratory examination: Secondary | ICD-10-CM

## 2012-09-04 DIAGNOSIS — M2180 Other specified acquired deformities of unspecified limb: Principal | ICD-10-CM | POA: Diagnosis present

## 2012-09-04 DIAGNOSIS — G4733 Obstructive sleep apnea (adult) (pediatric): Secondary | ICD-10-CM | POA: Diagnosis present

## 2012-09-04 DIAGNOSIS — E785 Hyperlipidemia, unspecified: Secondary | ICD-10-CM | POA: Diagnosis present

## 2012-09-04 DIAGNOSIS — K219 Gastro-esophageal reflux disease without esophagitis: Secondary | ICD-10-CM | POA: Diagnosis present

## 2012-09-04 DIAGNOSIS — E119 Type 2 diabetes mellitus without complications: Secondary | ICD-10-CM | POA: Diagnosis present

## 2012-09-04 DIAGNOSIS — E669 Obesity, unspecified: Secondary | ICD-10-CM | POA: Diagnosis present

## 2012-09-04 DIAGNOSIS — Z7982 Long term (current) use of aspirin: Secondary | ICD-10-CM

## 2012-09-04 DIAGNOSIS — I10 Essential (primary) hypertension: Secondary | ICD-10-CM | POA: Diagnosis present

## 2012-09-04 HISTORY — PX: TOTAL SHOULDER REVISION: SHX6130

## 2012-09-04 HISTORY — PX: REVERSE SHOULDER ARTHROPLASTY: SHX5054

## 2012-09-04 LAB — GLUCOSE, CAPILLARY: Glucose-Capillary: 145 mg/dL — ABNORMAL HIGH (ref 70–99)

## 2012-09-04 LAB — GRAM STAIN

## 2012-09-04 SURGERY — ARTHROPLASTY, SHOULDER, TOTAL, REVERSE
Anesthesia: General | Site: Shoulder | Laterality: Left | Wound class: Clean

## 2012-09-04 MED ORDER — LACTATED RINGERS IV SOLN
INTRAVENOUS | Status: DC | PRN
  Administered 2012-09-04 (×2): via INTRAVENOUS

## 2012-09-04 MED ORDER — POTASSIUM CHLORIDE IN NACL 20-0.9 MEQ/L-% IV SOLN
INTRAVENOUS | Status: DC
  Administered 2012-09-04: 50 mL/h via INTRAVENOUS
  Filled 2012-09-04 (×3): qty 1000

## 2012-09-04 MED ORDER — SODIUM & POTASSIUM BICARBONATE PO TBEF: 1.0000 | EFFERVESCENT_TABLET | Freq: Every day | ORAL | Status: DC | PRN

## 2012-09-04 MED ORDER — METOCLOPRAMIDE HCL 5 MG/ML IJ SOLN: 5.0000 mg | Freq: Three times a day (TID) | INTRAMUSCULAR | Status: DC | PRN

## 2012-09-04 MED ORDER — BISACODYL 10 MG RE SUPP: 10.0000 mg | Freq: Every day | RECTAL | Status: DC | PRN

## 2012-09-04 MED ORDER — METHOCARBAMOL 500 MG PO TABS
500.0000 mg | ORAL_TABLET | Freq: Four times a day (QID) | ORAL | Status: DC
  Administered 2012-09-04 – 2012-09-06 (×7): 500 mg via ORAL
  Filled 2012-09-04 (×13): qty 1

## 2012-09-04 MED ORDER — INSULIN ASPART 100 UNIT/ML ~~LOC~~ SOLN
0.0000 [IU] | Freq: Three times a day (TID) | SUBCUTANEOUS | Status: DC
  Administered 2012-09-05: 5 [IU] via SUBCUTANEOUS
  Administered 2012-09-06: 2 [IU] via SUBCUTANEOUS

## 2012-09-04 MED ORDER — OXYCODONE HCL 5 MG PO TABS: 5.0000 mg | ORAL_TABLET | Freq: Once | ORAL | Status: DC | PRN

## 2012-09-04 MED ORDER — PIOGLITAZONE HCL-METFORMIN HCL 15-850 MG PO TABS: 1.0000 | ORAL_TABLET | Freq: Two times a day (BID) | ORAL | Status: DC

## 2012-09-04 MED ORDER — PROMETHAZINE HCL 25 MG/ML IJ SOLN: 6.2500 mg | INTRAMUSCULAR | Status: DC | PRN

## 2012-09-04 MED ORDER — METOCLOPRAMIDE HCL 10 MG PO TABS: 5.0000 mg | ORAL_TABLET | Freq: Three times a day (TID) | ORAL | Status: DC | PRN

## 2012-09-04 MED ORDER — ACETAMINOPHEN 650 MG RE SUPP: 650.0000 mg | Freq: Four times a day (QID) | RECTAL | Status: DC | PRN

## 2012-09-04 MED ORDER — ROCURONIUM BROMIDE 100 MG/10ML IV SOLN
INTRAVENOUS | Status: DC | PRN
  Administered 2012-09-04: 50 mg via INTRAVENOUS

## 2012-09-04 MED ORDER — EPHEDRINE SULFATE 50 MG/ML IJ SOLN
INTRAMUSCULAR | Status: DC | PRN
  Administered 2012-09-04 (×4): 5 mg via INTRAVENOUS

## 2012-09-04 MED ORDER — PROPOFOL 10 MG/ML IV BOLUS
INTRAVENOUS | Status: DC | PRN
  Administered 2012-09-04: 180 mg via INTRAVENOUS

## 2012-09-04 MED ORDER — OXYCODONE-ACETAMINOPHEN 5-325 MG PO TABS
1.0000 | ORAL_TABLET | ORAL | Status: DC | PRN
  Administered 2012-09-04 – 2012-09-05 (×4): 2 via ORAL
  Administered 2012-09-05: 1 via ORAL
  Administered 2012-09-05 – 2012-09-06 (×3): 2 via ORAL
  Filled 2012-09-04 (×4): qty 2
  Filled 2012-09-04: qty 1
  Filled 2012-09-04 (×3): qty 2

## 2012-09-04 MED ORDER — ASPIRIN EC 81 MG PO TBEC
81.0000 mg | DELAYED_RELEASE_TABLET | Freq: Every day | ORAL | Status: DC
  Administered 2012-09-04 – 2012-09-06 (×3): 81 mg via ORAL
  Filled 2012-09-04 (×3): qty 1

## 2012-09-04 MED ORDER — HYDROCHLOROTHIAZIDE 25 MG PO TABS
12.5000 mg | ORAL_TABLET | Freq: Every day | ORAL | Status: DC
  Filled 2012-09-04 (×2): qty 0.5

## 2012-09-04 MED ORDER — FENTANYL CITRATE 0.05 MG/ML IJ SOLN
INTRAMUSCULAR | Status: AC
  Filled 2012-09-04: qty 2

## 2012-09-04 MED ORDER — SODIUM CHLORIDE 0.9 % IV SOLN
INTRAVENOUS | Status: DC | PRN
  Administered 2012-09-04: 11:00:00 via INTRAVENOUS

## 2012-09-04 MED ORDER — MIDAZOLAM HCL 2 MG/2ML IJ SOLN
INTRAMUSCULAR | Status: AC
  Filled 2012-09-04: qty 2

## 2012-09-04 MED ORDER — CHLORHEXIDINE GLUCONATE 4 % EX LIQD: 60.0000 mL | Freq: Once | CUTANEOUS | Status: DC

## 2012-09-04 MED ORDER — METHOCARBAMOL 100 MG/ML IJ SOLN
500.0000 mg | Freq: Four times a day (QID) | INTRAVENOUS | Status: DC
  Filled 2012-09-04 (×10): qty 5

## 2012-09-04 MED ORDER — SODIUM CHLORIDE 0.9 % IV SOLN
10.0000 mg | INTRAVENOUS | Status: DC | PRN
  Administered 2012-09-04: 10 ug/min via INTRAVENOUS

## 2012-09-04 MED ORDER — OXYCODONE-ACETAMINOPHEN 5-325 MG PO TABS: 1.0000 | ORAL_TABLET | ORAL | Status: DC | PRN

## 2012-09-04 MED ORDER — SODIUM CHLORIDE 0.9 % IR SOLN
Status: DC | PRN
  Administered 2012-09-04: 3000 mL
  Administered 2012-09-04: 1000 mL
  Administered 2012-09-04: 3000 mL

## 2012-09-04 MED ORDER — RAMIPRIL 10 MG PO CAPS
10.0000 mg | ORAL_CAPSULE | Freq: Every day | ORAL | Status: DC
  Administered 2012-09-05 – 2012-09-06 (×2): 10 mg via ORAL
  Filled 2012-09-04 (×4): qty 1

## 2012-09-04 MED ORDER — DEXAMETHASONE SODIUM PHOSPHATE 4 MG/ML IJ SOLN
INTRAMUSCULAR | Status: DC | PRN
  Administered 2012-09-04: 4 mg

## 2012-09-04 MED ORDER — SENNOSIDES-DOCUSATE SODIUM 8.6-50 MG PO TABS
1.0000 | ORAL_TABLET | Freq: Two times a day (BID) | ORAL | Status: DC
  Administered 2012-09-04 – 2012-09-06 (×4): 1 via ORAL
  Filled 2012-09-04 (×4): qty 1

## 2012-09-04 MED ORDER — ACETAMINOPHEN 325 MG PO TABS: 650.0000 mg | ORAL_TABLET | Freq: Four times a day (QID) | ORAL | Status: DC | PRN

## 2012-09-04 MED ORDER — FENTANYL CITRATE 0.05 MG/ML IJ SOLN
25.0000 ug | INTRAMUSCULAR | Status: DC | PRN
  Administered 2012-09-04: 50 ug via INTRAVENOUS

## 2012-09-04 MED ORDER — ONDANSETRON HCL 4 MG PO TABS: 4.0000 mg | ORAL_TABLET | Freq: Four times a day (QID) | ORAL | Status: DC | PRN

## 2012-09-04 MED ORDER — INSULIN DETEMIR 100 UNIT/ML ~~LOC~~ SOLN
50.0000 [IU] | Freq: Every day | SUBCUTANEOUS | Status: DC
  Administered 2012-09-05 – 2012-09-06 (×2): 50 [IU] via SUBCUTANEOUS
  Filled 2012-09-04: qty 10

## 2012-09-04 MED ORDER — FENTANYL CITRATE 0.05 MG/ML IJ SOLN
INTRAMUSCULAR | Status: DC | PRN
  Administered 2012-09-04: 50 ug via INTRAVENOUS
  Administered 2012-09-04: 100 ug via INTRAVENOUS

## 2012-09-04 MED ORDER — CALCIUM CARBONATE ANTACID 500 MG PO CHEW
1.0000 | CHEWABLE_TABLET | Freq: Every day | ORAL | Status: DC | PRN
  Administered 2012-09-05 (×2): 200 mg via ORAL
  Filled 2012-09-04: qty 1

## 2012-09-04 MED ORDER — PIOGLITAZONE HCL 15 MG PO TABS
15.0000 mg | ORAL_TABLET | Freq: Two times a day (BID) | ORAL | Status: DC
  Administered 2012-09-05 – 2012-09-06 (×2): 15 mg via ORAL
  Filled 2012-09-04 (×4): qty 1

## 2012-09-04 MED ORDER — ONDANSETRON HCL 4 MG/2ML IJ SOLN: 4.0000 mg | Freq: Four times a day (QID) | INTRAMUSCULAR | Status: DC | PRN

## 2012-09-04 MED ORDER — OXYCODONE HCL 5 MG/5ML PO SOLN: 5.0000 mg | Freq: Once | ORAL | Status: DC | PRN

## 2012-09-04 MED ORDER — GABAPENTIN 300 MG PO CAPS
300.0000 mg | ORAL_CAPSULE | Freq: Two times a day (BID) | ORAL | Status: DC
Start: 2012-09-04 — End: 2012-09-06
  Administered 2012-09-04 – 2012-09-06 (×4): 300 mg via ORAL
  Filled 2012-09-04 (×5): qty 1

## 2012-09-04 MED ORDER — FENOFIBRATE 54 MG PO TABS
54.0000 mg | ORAL_TABLET | Freq: Every day | ORAL | Status: DC
  Administered 2012-09-04 – 2012-09-06 (×3): 54 mg via ORAL
  Filled 2012-09-04 (×3): qty 1

## 2012-09-04 MED ORDER — INSULIN ASPART 100 UNIT/ML ~~LOC~~ SOLN
0.0000 [IU] | Freq: Every day | SUBCUTANEOUS | Status: DC
  Administered 2012-09-04: 2 [IU] via SUBCUTANEOUS

## 2012-09-04 MED ORDER — ATORVASTATIN CALCIUM 20 MG PO TABS
20.0000 mg | ORAL_TABLET | Freq: Every day | ORAL | Status: DC
Start: 2012-09-04 — End: 2012-09-06
  Administered 2012-09-04 – 2012-09-05 (×2): 20 mg via ORAL
  Filled 2012-09-04 (×3): qty 1

## 2012-09-04 MED ORDER — HYDROMORPHONE HCL PF 1 MG/ML IJ SOLN: 0.5000 mg | INTRAMUSCULAR | Status: DC | PRN

## 2012-09-04 MED ORDER — PHENOL 1.4 % MT LIQD: 1.0000 | OROMUCOSAL | Status: DC | PRN

## 2012-09-04 MED ORDER — LIDOCAINE HCL (CARDIAC) 20 MG/ML IV SOLN
INTRAVENOUS | Status: DC | PRN
  Administered 2012-09-04: 80 mg via INTRAVENOUS

## 2012-09-04 MED ORDER — VITAMIN C 500 MG PO TABS
500.0000 mg | ORAL_TABLET | Freq: Two times a day (BID) | ORAL | Status: DC
Start: 2012-09-04 — End: 2012-09-06
  Administered 2012-09-04 – 2012-09-06 (×4): 500 mg via ORAL
  Filled 2012-09-04 (×5): qty 1

## 2012-09-04 MED ORDER — BUPIVACAINE-EPINEPHRINE 0.25% -1:200000 IJ SOLN
INTRAMUSCULAR | Status: DC | PRN
  Administered 2012-09-04: 30 mL

## 2012-09-04 MED ORDER — FENTANYL CITRATE 0.05 MG/ML IJ SOLN
50.0000 ug | Freq: Once | INTRAMUSCULAR | Status: AC
  Administered 2012-09-04: 100 ug via INTRAVENOUS

## 2012-09-04 MED ORDER — INSULIN ASPART 100 UNIT/ML ~~LOC~~ SOLN
4.0000 [IU] | Freq: Three times a day (TID) | SUBCUTANEOUS | Status: DC
  Administered 2012-09-05 – 2012-09-06 (×2): 4 [IU] via SUBCUTANEOUS

## 2012-09-04 MED ORDER — ONDANSETRON HCL 4 MG/2ML IJ SOLN
INTRAMUSCULAR | Status: DC | PRN
  Administered 2012-09-04: 4 mg via INTRAVENOUS

## 2012-09-04 MED ORDER — MIDAZOLAM HCL 2 MG/2ML IJ SOLN
1.0000 mg | INTRAMUSCULAR | Status: DC | PRN
  Administered 2012-09-04: 2 mg via INTRAVENOUS

## 2012-09-04 MED ORDER — LACTATED RINGERS IV SOLN
INTRAVENOUS | Status: DC
  Administered 2012-09-04: 09:00:00 via INTRAVENOUS

## 2012-09-04 MED ORDER — MENTHOL 3 MG MT LOZG: 1.0000 | LOZENGE | OROMUCOSAL | Status: DC | PRN

## 2012-09-04 MED ORDER — METHOCARBAMOL 500 MG PO TABS: 500.0000 mg | ORAL_TABLET | Freq: Three times a day (TID) | ORAL | Status: DC | PRN

## 2012-09-04 MED ORDER — VANCOMYCIN HCL IN DEXTROSE 1-5 GM/200ML-% IV SOLN
1000.0000 mg | Freq: Two times a day (BID) | INTRAVENOUS | Status: AC
  Administered 2012-09-04: 1000 mg via INTRAVENOUS
  Filled 2012-09-04: qty 200

## 2012-09-04 MED ORDER — SODIUM CHLORIDE 0.9 % IV SOLN: 10.0000 mg | INTRAVENOUS | Status: DC | PRN

## 2012-09-04 MED ORDER — PHENYLEPHRINE HCL 10 MG/ML IJ SOLN
INTRAMUSCULAR | Status: DC | PRN
  Administered 2012-09-04 (×5): 80 ug via INTRAVENOUS

## 2012-09-04 MED ORDER — SULFAMETHOXAZOLE-TMP DS 800-160 MG PO TABS
1.0000 | ORAL_TABLET | Freq: Two times a day (BID) | ORAL | Status: DC
  Administered 2012-09-04 – 2012-09-06 (×4): 1 via ORAL
  Filled 2012-09-04 (×5): qty 1

## 2012-09-04 MED ORDER — BUPIVACAINE-EPINEPHRINE PF 0.5-1:200000 % IJ SOLN
INTRAMUSCULAR | Status: DC | PRN
  Administered 2012-09-04: 25 mL

## 2012-09-04 SURGICAL SUPPLY — 81 items
BIT DRILL 170X2.5X (BIT) ×1 IMPLANT
BIT DRL 170X2.5X (BIT) ×1
BLADE SAG 18X100X1.27 (BLADE) ×2 IMPLANT
BONE CEMENT GENTAMICIN (Cement) ×4 IMPLANT
BOWL SMART MIX CTS (DISPOSABLE) IMPLANT
BRUSH FEMORAL CANAL (MISCELLANEOUS) IMPLANT
BUR SURG 4X8 MED (BURR) IMPLANT
BURR SURG 4X8 MED (BURR)
CEMENT BONE GENTAMICIN 40 (Cement) ×2 IMPLANT
CLOTH BEACON ORANGE TIMEOUT ST (SAFETY) ×2 IMPLANT
CLSR STERI-STRIP ANTIMIC 1/2X4 (GAUZE/BANDAGES/DRESSINGS) ×2 IMPLANT
COVER SURGICAL LIGHT HANDLE (MISCELLANEOUS) ×2 IMPLANT
DRAPE INCISE IOBAN 66X45 STRL (DRAPES) ×2 IMPLANT
DRAPE U-SHAPE 47X51 STRL (DRAPES) ×2 IMPLANT
DRAPE X-RAY CASS 24X20 (DRAPES) IMPLANT
DRILL 2.5 (BIT) ×2
DRILL BIT 5/64 (BIT) IMPLANT
DRSG ADAPTIC 3X8 NADH LF (GAUZE/BANDAGES/DRESSINGS) IMPLANT
DRSG MEPILEX BORDER 4X8 (GAUZE/BANDAGES/DRESSINGS) ×2 IMPLANT
DRSG PAD ABDOMINAL 8X10 ST (GAUZE/BANDAGES/DRESSINGS) IMPLANT
DURAPREP 26ML APPLICATOR (WOUND CARE) ×2 IMPLANT
ELECT BLADE 4.0 EZ CLEAN MEGAD (MISCELLANEOUS) ×2
ELECT NEEDLE TIP 2.8 STRL (NEEDLE) ×2 IMPLANT
ELECT REM PT RETURN 9FT ADLT (ELECTROSURGICAL) ×2
ELECTRODE BLDE 4.0 EZ CLN MEGD (MISCELLANEOUS) ×1 IMPLANT
ELECTRODE REM PT RTRN 9FT ADLT (ELECTROSURGICAL) ×1 IMPLANT
GLOVE BIOGEL PI ORTHO PRO 7.5 (GLOVE) ×2
GLOVE BIOGEL PI ORTHO PRO SZ7 (GLOVE) ×1
GLOVE BIOGEL PI ORTHO PRO SZ8 (GLOVE) ×2
GLOVE ORTHO TXT STRL SZ7.5 (GLOVE) ×2 IMPLANT
GLOVE PI ORTHO PRO STRL 7.5 (GLOVE) ×2 IMPLANT
GLOVE PI ORTHO PRO STRL SZ7 (GLOVE) ×1 IMPLANT
GLOVE PI ORTHO PRO STRL SZ8 (GLOVE) ×2 IMPLANT
GLOVE SURG ORTHO 8.5 STRL (GLOVE) ×4 IMPLANT
GLOVE SURG SS PI 7.0 STRL IVOR (GLOVE) ×6 IMPLANT
GOWN STRL NON-REIN LRG LVL3 (GOWN DISPOSABLE) IMPLANT
GOWN STRL REIN XL XLG (GOWN DISPOSABLE) ×8 IMPLANT
GUIDEWIRE 3.2X28 (WIRE) ×2 IMPLANT
HANDPIECE INTERPULSE COAX TIP (DISPOSABLE)
KIT BASIN OR (CUSTOM PROCEDURE TRAY) ×2 IMPLANT
KIT ROOM TURNOVER OR (KITS) ×2 IMPLANT
MANIFOLD NEPTUNE II (INSTRUMENTS) ×2 IMPLANT
NDL SUT 6 .5 CRC .975X.05 MAYO (NEEDLE) ×1 IMPLANT
NEEDLE 1/2 CIR MAYO (NEEDLE) ×2 IMPLANT
NEEDLE HYPO 25GX1X1/2 BEV (NEEDLE) ×2 IMPLANT
NEEDLE MAYO TAPER (NEEDLE) ×2
NOZZLE PRISM 8.5MM (MISCELLANEOUS) ×2 IMPLANT
NS IRRIG 1000ML POUR BTL (IV SOLUTION) ×2 IMPLANT
PACK SHOULDER (CUSTOM PROCEDURE TRAY) ×2 IMPLANT
PAD ARMBOARD 7.5X6 YLW CONV (MISCELLANEOUS) ×4 IMPLANT
SCREW 48L (Screw) ×2 IMPLANT
SET HNDPC FAN SPRY TIP SCT (DISPOSABLE) IMPLANT
SLING ARM FOAM STRAP LRG (SOFTGOODS) ×2 IMPLANT
SLING ARM FOAM STRAP MED (SOFTGOODS) IMPLANT
SLING ARM IMMOBILIZER LRG (SOFTGOODS) ×2 IMPLANT
SLING ARM IMMOBILIZER MED (SOFTGOODS) IMPLANT
SPONGE GAUZE 4X4 12PLY (GAUZE/BANDAGES/DRESSINGS) IMPLANT
SPONGE LAP 18X18 X RAY DECT (DISPOSABLE) ×2 IMPLANT
SPONGE LAP 4X18 X RAY DECT (DISPOSABLE) ×2 IMPLANT
STRIP CLOSURE SKIN 1/2X4 (GAUZE/BANDAGES/DRESSINGS) IMPLANT
SUCTION FRAZIER TIP 10 FR DISP (SUCTIONS) ×2 IMPLANT
SUT FIBERWIRE #2 38 T-5 BLUE (SUTURE)
SUT MNCRL AB 4-0 PS2 18 (SUTURE) ×2 IMPLANT
SUT VIC AB 0 CT1 27 (SUTURE) ×2
SUT VIC AB 0 CT1 27XBRD ANBCTR (SUTURE) ×1 IMPLANT
SUT VIC AB 2-0 CT1 27 (SUTURE) ×2
SUT VIC AB 2-0 CT1 TAPERPNT 27 (SUTURE) ×1 IMPLANT
SUT VICRYL 0 CT 1 36IN (SUTURE) ×4 IMPLANT
SUT VICRYL AB 2 0 TIES (SUTURE) ×2 IMPLANT
SUTURE FIBERWR #2 38 T-5 BLUE (SUTURE) IMPLANT
SWABSTICK BENZOIN STERILE (MISCELLANEOUS) IMPLANT
SYR CONTROL 10ML LL (SYRINGE) IMPLANT
SYR TOOMEY 50ML (SYRINGE) IMPLANT
TAPE CLOTH SURG 6X10 WHT LF (GAUZE/BANDAGES/DRESSINGS) ×2 IMPLANT
TOWEL OR 17X24 6PK STRL BLUE (TOWEL DISPOSABLE) ×2 IMPLANT
TOWEL OR 17X26 10 PK STRL BLUE (TOWEL DISPOSABLE) ×2 IMPLANT
TOWER CARTRIDGE SMART MIX (DISPOSABLE) IMPLANT
TRAY FOLEY CATH 14FR (SET/KITS/TRAYS/PACK) ×2 IMPLANT
TUBE ANAEROBIC SPECIMEN COL (MISCELLANEOUS) ×2 IMPLANT
WATER STERILE IRR 1000ML POUR (IV SOLUTION) ×2 IMPLANT
YANKAUER SUCT BULB TIP NO VENT (SUCTIONS) IMPLANT

## 2012-09-04 NOTE — Preoperative (Signed)
Beta Blockers   Reason not to administer Beta Blockers:Not Applicable 

## 2012-09-04 NOTE — Interval H&P Note (Signed)
History and Physical Interval Note:  09/04/2012 10:29 AM  [name]  has presented today for surgery, with the diagnosis of LEFT SHOULDER INFECTION ARTHRITIS  The various methods of treatment have been discussed with the patient and family. After consideration of risks, benefits and other options for treatment, the patient has consented to  Procedure(s) (LRB) with comments: REVERSE SHOULDER ARTHROPLASTY (Left) - LEFT SHOULDER REVISION, REVERSE TOTAL SHOULDER ARTHROPLASTY TOTAL SHOULDER REVISION (Left) as a surgical intervention .  The patient's history has been reviewed, patient examined, no change in status, stable for surgery.  I have reviewed the patient's chart and labs.  Questions were answered to the patient's satisfaction.     Aviella Disbrow,STEVEN R

## 2012-09-04 NOTE — Anesthesia Preprocedure Evaluation (Addendum)
Anesthesia Evaluation  Patient identified by MRN, date of birth, ID band Patient awake    Reviewed: Allergy & Precautions, H&P , NPO status , Patient's Chart, lab work & pertinent test results  Airway Mallampati: II  Neck ROM: full    Dental  (+) Chipped and Teeth Intact,    Pulmonary sleep apnea ,  breath sounds clear to auscultation        Cardiovascular hypertension, Rhythm:Regular Rate:Normal     Neuro/Psych    GI/Hepatic GERD-  ,  Endo/Other  diabetes, Type 2obese  Renal/GU Renal InsufficiencyRenal disease     Musculoskeletal   Abdominal (+) + obese,   Peds  Hematology   Anesthesia Other Findings   Reproductive/Obstetrics                          Anesthesia Physical Anesthesia Plan  ASA: III  Anesthesia Plan: General   Post-op Pain Management:    Induction: Intravenous  Airway Management Planned: Oral ETT  Additional Equipment:   Intra-op Plan:   Post-operative Plan: Extubation in OR  Informed Consent: I have reviewed the patients History and Physical, chart, labs and discussed the procedure including the risks, benefits and alternatives for the proposed anesthesia with the patient or authorized representative who has indicated his/her understanding and acceptance.     Plan Discussed with: CRNA and Surgeon  Anesthesia Plan Comments:         Anesthesia Quick Evaluation

## 2012-09-04 NOTE — Progress Notes (Signed)
Report given to rebecca rn as caregiver 

## 2012-09-04 NOTE — Progress Notes (Signed)
Spoke with pt about CPAP and he states he wears a CPAP at home ' almost every night ' however, while here at the hospital he prefers not to wear one. Informed pt to notify RN if he were to change his mind. RT will continue to monitor.

## 2012-09-04 NOTE — Anesthesia Postprocedure Evaluation (Signed)
Anesthesia Post Note  Patient: [name]  Procedure(s) Performed: Procedure(s) (LRB): REVERSE SHOULDER ARTHROPLASTY (Left) TOTAL SHOULDER REVISION (Left)  Anesthesia type: General  Patient location: PACU  Post pain: Pain level controlled and Adequate analgesia  Post assessment: Post-op Vital signs reviewed, Patient's Cardiovascular Status Stable, Respiratory Function Stable, Patent Airway and Pain level controlled  Last Vitals:  Filed Vitals:   09/04/12 1600  BP: 138/74  Pulse: 92  Temp:   Resp: 19    Post vital signs: Reviewed and stable  Level of consciousness: awake, alert  and oriented  Complications: No apparent anesthesia complications

## 2012-09-04 NOTE — Anesthesia Procedure Notes (Addendum)
Anesthesia Regional Block:  Interscalene brachial plexus block  Pre-Anesthetic Checklist: ,, timeout performed, Correct Patient, Correct Site, Correct Laterality, Correct Procedure, Correct Position, site marked, Risks and benefits discussed,  Surgical consent,  Pre-op evaluation,  At surgeon's request and post-op pain management  Laterality: Left  Prep: chloraprep       Needles:  Injection technique: Single-shot  Needle Type: Echogenic Needle     Needle Length: 5cm 5 cm Needle Gauge: 22 and 22 G    Additional Needles:  Procedures: nerve stimulator Interscalene brachial plexus block  Nerve Stimulator or Paresthesia:  Response: 0.48 mA,   Additional Responses:   Narrative:  Start time: 09/04/2012 8:59 AM End time: 09/04/2012 9:07 AM Injection made incrementally with aspirations every 5 mL. Anesthesiologist: Dr Gypsy Balsam  Additional Notes: 5621-3086 L ISB POP CHG prep, sterile tech #22 stim needle w/stim down to .48ma Multiple neg asp Marc .5% w/epi 25cc+decadron 4mg  infil No compl Dr Gypsy Balsam  Interscalene brachial plexus block Procedure Name: Intubation Date/Time: 09/04/2012 11:16 AM Performed by: Sharlene Dory E Pre-anesthesia Checklist: Patient identified, Emergency Drugs available, Suction available, Patient being monitored and Timeout performed Patient Re-evaluated:Patient Re-evaluated prior to inductionOxygen Delivery Method: Circle system utilized Preoxygenation: Pre-oxygenation with 100% oxygen Intubation Type: IV induction Ventilation: Mask ventilation without difficulty and Oral airway inserted - appropriate to patient size Laryngoscope Size: Mac and 4 Grade View: Grade IV Tube type: Oral Tube size: 7.5 mm Number of attempts: 2 Airway Equipment and Method: Stylet and Video-laryngoscopy Placement Confirmation: ETT inserted through vocal cords under direct vision,  positive ETCO2 and breath sounds checked- equal and bilateral Secured at: 21 cm Tube secured  with: Tape Dental Injury: Teeth and Oropharynx as per pre-operative assessment  Comments: Unable to visualize VC on Ist attempt using MAC4 blade. 2nd attempt successful with glidescope, Grade I view. AOI, +ETCO2 & =BBS.

## 2012-09-04 NOTE — Brief Op Note (Signed)
09/04/2012  3:44 PM  PATIENT:  [name]  56 y.o. male  PRE-OPERATIVE DIAGNOSIS:  prior infection left shoulder with mid shaft fracture, s/p placement of antibiotic spacer POST-OPERATIVE DIAGNOSIS:  prior infection left shoulder with mid shaft fracture, s/p placement of antibiotic spacer PROCEDURE:  Procedure(s) (LRB) with comments: REVERSE SHOULDER ARTHROPLASTY (Left) - LEFT SHOULDER REVISION, REVERSE TOTAL SHOULDER ARTHROPLASTY TOTAL SHOULDER REVISION (Left) - left shoulder cement spacer removal and a reverse shoulder arthroplasty  SURGEON:  Surgeon(s) and Role:    * Verlee Rossetti, MD - Primary  PHYSICIAN ASSISTANT:   ASSISTANTS: Thea Gist, PA-C   ANESTHESIA:   regional and general  EBL:  Total I/O In: 2400 [I.V.:2400] Out: 900 [Urine:800; Blood:100]  BLOOD ADMINISTERED:none  DRAINS: none   LOCAL MEDICATIONS USED:  MARCAINE     SPECIMEN:  Source of Specimen:  left shoulder soft tissue  DISPOSITION OF SPECIMEN:  PATHOLOGY  COUNTS:  YES  TOURNIQUET:  * No tourniquets in log *  DICTATION: .Other Dictation: Dictation Number [id number]  PLAN OF CARE: Admit to inpatient   PATIENT DISPOSITION:  PACU - hemodynamically stable.   Delay start of Pharmacological VTE agent (>24hrs) due to surgical blood loss or risk of bleeding: not applicable

## 2012-09-04 NOTE — Transfer of Care (Signed)
Immediate Anesthesia Transfer of Care Note  Patient: [name]  Procedure(s) Performed: Procedure(s) (LRB) with comments: REVERSE SHOULDER ARTHROPLASTY (Left) - LEFT SHOULDER REVISION, REVERSE TOTAL SHOULDER ARTHROPLASTY TOTAL SHOULDER REVISION (Left) - left shoulder cement spacer removal and a reverse shoulder arthroplasty  Patient Location: PACU  Anesthesia Type:General  Level of Consciousness: awake, alert  and oriented  Airway & Oxygen Therapy: Patient Spontanous Breathing and Patient connected to nasal cannula oxygen  Post-op Assessment: Report given to PACU RN, Post -op Vital signs reviewed and stable and Patient moving all extremities  Post vital signs: Reviewed and stable  Complications: No apparent anesthesia complications

## 2012-09-04 NOTE — H&P (View-Only) (Signed)
CC: left shoulder pain  HPI: 56 y/o male with history of infection to left shoulder with current antibiotic space in the left shoulder. Pt has elected for revision reverse total shoulder to decrease pain and increase function PMH: hypertension, hyperlipidemia, diabetes, sleep apnea Social: non smoker, occasional etoh, married Allergies: norco, penicillin, adhesives Meds: actoplus-metformin, HCTZ, aspirin, ramipril, fenofibrate, levemir, gabapentin, septra, atorvastatin Family: hypertension, coronary heart disease, CVA, cancer ROS: pain and limited rom with left shoulder PE: alert and appropriate 56 y/o male in no acute distress Cervical spine with full rom, cranial nerves 2-12 intact Left shoulder: limited rom with antibiotic spacer in place left shoulder nv intact distally No rashes or edema Chest; active breath sounds  X-rays: s/p antibiotic spacer to left shoulder with healed mid shaft humerus fracture Assessment: healed humerus fracture with left shoulder antibiotic spacer Plan; revision reverse total shoulder to decrease pain and increase function 

## 2012-09-05 LAB — GLUCOSE, CAPILLARY
Glucose-Capillary: 164 mg/dL — ABNORMAL HIGH (ref 70–99)
Glucose-Capillary: 249 mg/dL — ABNORMAL HIGH (ref 70–99)
Glucose-Capillary: 185 mg/dL — ABNORMAL HIGH (ref 70–99)

## 2012-09-05 LAB — BASIC METABOLIC PANEL
Calcium: 8.9 mg/dL (ref 8.4–10.5)
GFR calc Af Amer: 68 mL/min — ABNORMAL LOW (ref >90)
GFR calc non Af Amer: 59 mL/min — ABNORMAL LOW (ref >90)
Glucose, Bld: 188 mg/dL — ABNORMAL HIGH (ref 70–99)
Sodium: 138 mEq/L (ref 135–145)

## 2012-09-05 LAB — HEMOGLOBIN AND HEMATOCRIT, BLOOD: Hemoglobin: 11.1 g/dL — ABNORMAL LOW (ref 13.0–17.0)

## 2012-09-05 MED ORDER — METFORMIN HCL 850 MG PO TABS
850.0000 mg | ORAL_TABLET | Freq: Two times a day (BID) | ORAL | Status: DC
  Administered 2012-09-05 – 2012-09-06 (×2): 850 mg via ORAL
  Filled 2012-09-05 (×6): qty 1

## 2012-09-05 MED ORDER — HYDROCHLOROTHIAZIDE 12.5 MG PO CAPS
12.5000 mg | ORAL_CAPSULE | Freq: Every day | ORAL | Status: DC
  Administered 2012-09-05 – 2012-09-06 (×2): 12.5 mg via ORAL
  Filled 2012-09-05 (×2): qty 1

## 2012-09-05 NOTE — Progress Notes (Signed)
Subjective: 1 Day Post-Op Procedure(s) (LRB): REVERSE SHOULDER ARTHROPLASTY (Left) TOTAL SHOULDER REVISION (Left) Patient reports pain as 3 on 0-10 scale.    Objective: Vital signs in last 24 hours: Temp:  [97.7 F (36.5 C)-98.8 F (37.1 C)] 98.8 F (37.1 C) (11/09 0650) Pulse Rate:  [71-99] 87  (11/09 0650) Resp:  [16-24] 16  (11/09 0650) BP: (98-138)/(56-74) 121/66 mmHg (11/09 0650) SpO2:  [99 %-100 %] 100 % (11/09 0650) Weight:  [110.224 kg (243 lb)] 110.224 kg (243 lb) (11/08 1700)  Intake/Output from previous day: 11/08 0701 - 11/09 0700 In: 2928.3 [I.V.:2728.3; IV Piggyback:200] Out: 2150 [Urine:2050; Blood:100] Intake/Output this shift:    No results found for this basename: HGB:5 in the last 72 hours No results found for this basename: WBC:2,RBC:2,HCT:2,PLT:2 in the last 72 hours No results found for this basename: NA:2,K:2,CL:2,CO2:2,BUN:2,CREATININE:2,GLUCOSE:2,CALCIUM:2 in the last 72 hours No results found for this basename: LABPT:2,INR:2 in the last 72 hours  Neurologically intact Sensation intact distally Intact pulses distally Incision: dressing C/D/I  Assessment/Plan: 1 Day Post-Op Procedure(s) (LRB): REVERSE SHOULDER ARTHROPLASTY (Left) TOTAL SHOULDER REVISION (Left) Plan for discharge tomorrow  Zela Sobieski C 09/05/2012, 7:58 AM

## 2012-09-05 NOTE — Evaluation (Signed)
Occupational Therapy Evaluation Patient Details Name: [name] MRN: [id number] DOB: Sep 06, 1956 Today's Date: 09/05/2012 Time: 4098-1191 OT Time Calculation (min): 31 min  OT Assessment / Plan / Recommendation Clinical Impression  Pt s/p Left shoulder antibiotic spacer removal, scar tissue removal, revision of reverse total shoulder arthroplasty. Only orders are for ADL and sling education. Attempted to reach MD and PA multiple times with no response- therefore, will hold on any shoulder ROM at this time until orders are clarified. Will follow pt acutely. No OT needs at d/c as pt will receive OPPT.     OT Assessment  Patient needs continued OT Services    Follow Up Recommendations  No OT follow up (pt to receive OPPT)    Barriers to Discharge      Equipment Recommendations  None recommended by OT    Recommendations for Other Services    Frequency  Min 2X/week    Precautions / Restrictions Precautions Precautions: Shoulder Type of Shoulder Precautions: in sling with ambulation Required Braces or Orthoses:  (LUE sling with ambulation) Restrictions Weight Bearing Restrictions: Yes LUE Weight Bearing: Non weight bearing   Pertinent Vitals/Pain Pt reports LUE pain but did not rate- RN provided medication and pt repositioned for pain relief.    ADL  Grooming: Performed;Wash/dry hands;Modified independent Where Assessed - Grooming: Unsupported standing Lower Body Dressing: Minimal assistance Where Assessed - Lower Body Dressing: Unsupported sit to stand Toilet Transfer: Supervision/safety Toilet Transfer Method: Sit to Barista:  (chair with armrests) Toileting - Clothing Manipulation and Hygiene: Supervision/safety Where Assessed - Toileting Clothing Manipulation and Hygiene: Standing Equipment Used: Gait belt Transfers/Ambulation Related to ADLs: Min guard A with ambulation; pt with posterior LOB x 2. Able to self-correct. Pt encouraged to use  cane at home initially ADL Comments: Pt educated on sling donning/doffing and wearing schedule; elbow, wrist and hand ROM and proper placement of arm in sling and in bed. Pt only has orders for Sling and ADL education- no orders for ROM. Have attempted to call Dr. Ranell Patrick as well as Nida Boatman, his PA x5 with no response. Because pt's case was complicated, unsure if Dr. Ranell Patrick would like typical Reverse TSA protocol. Will continue to attempt to clarify. Pt has been informed of this and was asked to clarify with Dr. Ranell Patrick or Nida Boatman the next time he sees them. Will hold on any shoulder ROM at this time.     OT Diagnosis: Generalized weakness;Acute pain  OT Problem List: Decreased activity tolerance;Decreased range of motion;Impaired balance (sitting and/or standing);Decreased knowledge of use of DME or AE;Decreased knowledge of precautions;Pain;Impaired UE functional use;Increased edema OT Treatment Interventions: Self-care/ADL training;DME and/or AE instruction;Therapeutic activities;Patient/family education;Balance training   OT Goals Acute Rehab OT Goals OT Goal Formulation: With patient Time For Goal Achievement: 09/12/12 Potential to Achieve Goals: Good ADL Goals Additional ADL Goal #1: Pt will be I with elbow, wrist and hand ROM to maintain range and decrease edema. ADL Goal: Additional Goal #1 - Progress: Goal set today Additional ADL Goal #2: Pt will I'ly verbalize/demonstrate UB ADL adaptations. ADL Goal: Additional Goal #2 - Progress: Goal set today  Visit Information  Last OT Received On: 09/05/12 Assistance Needed: +1    Subjective Data  Subjective: I thought sitting inthe chair would be comfortable- turns out it's not. Patient Stated Goal: Return home   Prior Functioning     Home Living Lives With: Spouse Available Help at Discharge: Available 24 hours/day;Family (wife has been out for Carpal tunnel  sx ) Type of Home: Mobile home Home Access: Stairs to enter Entrance  Stairs-Number of Steps: 4 Entrance Stairs-Rails: Right;Left Home Layout: One level Bathroom Shower/Tub: Forensic scientist: Standard Home Adaptive Equipment: Straight cane;Walker - standard (lift chair) Prior Function Level of Independence: Independent Able to Take Stairs?: Reciprically Driving: Yes Vocation: Retired Comments: recently retired from [employer] Communication Communication: No difficulties Dominant Hand: Right         Cognition  Overall Cognitive Status: Appears within functional limits for tasks assessed/performed Arousal/Alertness: Awake/alert Orientation Level: Appears intact for tasks assessed Behavior During Session: [facility] for tasks performed    Extremity/Trunk Assessment Right Upper Extremity Assessment RUE ROM/Strength/Tone: Within functional levels RUE Sensation: WFL - Light Touch RUE Coordination: WFL - gross/fine motor Left Upper Extremity Assessment LUE ROM/Strength/Tone: Unable to fully assess;Due to precautions;Due to pain LUE Sensation:  (numbness in thumb due to nerve block)     Mobility Bed Mobility Bed Mobility: Sit to Supine Sit to Supine: 5: Supervision;HOB flat Details for Bed Mobility Assistance: VC for technique- pt did not require physical assist     Shoulder Instructions  sling education; AROM elbow and distally; ADL adaptations; Edema management; position for sleep   End of Session OT - End of Session Equipment Utilized During Treatment: Gait belt Activity Tolerance: Patient tolerated treatment well Patient left: in bed;with call bell/phone within reach Nurse Communication: Mobility status  GO     Anyssa Sharpless 09/05/2012, 1:04 PM

## 2012-09-05 NOTE — Progress Notes (Signed)
Pt refused to wear CPAP tonight. Pt informed to tell RN if he changes his mind. RT will continue to monitor.

## 2012-09-05 NOTE — Progress Notes (Signed)
Orthopedics Progress Note  Subjective: Stable overnight. Minimal pain this AM.  Objective:  Filed Vitals:   09/05/12 0650  BP: 121/66  Pulse: 87  Temp: 98.8 F (37.1 C)  Resp: 16    General: Awake and alert  Musculoskeletal: dressing clean and dry and intact, NVI including Ax nerve, only numbness is the thumb(block). Neurovascularly intact  Lab Results  Component Value Date   WBC 4.6 08/27/2012   HGB 11.1* 09/05/2012   HCT 33.2* 09/05/2012   MCV 92.0 08/27/2012   PLT 266 08/27/2012       Component Value Date/Time   NA 137 08/27/2012 0900   K 4.3 08/27/2012 0900   CL 100 08/27/2012 0900   CO2 23 08/27/2012 0900   GLUCOSE 160* 08/27/2012 0900   BUN 24* 08/27/2012 0900   CREATININE 1.69* 08/27/2012 0900   CREATININE 1.49* 06/10/2012 0937   CALCIUM 10.2 08/27/2012 0900   GFRNONAA 43* 08/27/2012 0900   GFRAA 50* 08/27/2012 0900    Lab Results  Component Value Date   INR 1.00 06/06/2011    Assessment/Plan: POD #1 s/p Procedure(s): REVERSE SHOULDER ARTHROPLASTY TOTAL SHOULDER REVISION D/c tomorrow  Viviann Spare R. Ranell Patrick, MD 09/05/2012 8:27 AM

## 2012-09-05 NOTE — Op Note (Signed)
[name], [name] NO.:  [id number]  MEDICAL RECORD NO.:  [id number]  LOCATION:  5N17C                        FACILITY:  [facility]  PHYSICIAN:  Almedia Balls. Ranell Patrick, M.D. 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  PROCEDURE PERFORMED:  Left shoulder antibiotic spacer removal, scar tissue removal, revision reverse total shoulder arthroplasty using DePuy Delta Xtend prosthesis long-stem.  ATTENDING SURGEON:  Almedia Balls. Ranell Patrick, MD  ASSISTANT:  Donnie Coffin. Dixon, PAC who scrubbed the entire procedure necessary for satisfactory completion of surgery.  ANESTHESIA:  General anesthesia was used plus interscalene block.  ESTIMATED BLOOD LOSS:  Minimal.  FLUID REPLACEMENT:  1200 mL of crystalloids.  INSTRUMENT COUNTS:  Correct.  COMPLICATIONS:  There were no complications.  Perioperative antibiotics were given.  INDICATIONS:  The patient is a 56 year old male status post prior left shoulder fracture requiring shoulder hemiarthroplasty.  The patient developed postoperative infection requiring removal of patient's cemented implant.  During implant removal, the patient's humeral shaft was fractured.  This was felt to be a stable fracture and an antibiotic spacer was placed.  The fracture went on to heal, and the patient's infection has been eradicated.  The patient presents now for operative removal of the cement spacer, was impregnated with gentamicin and then placement of a DePuy Delta Xtend reverse shoulder arthroplasty.  Risk and benefits of surgery were discussed, including but not limited to, infection, bleeding,  nerve damage, dislocation of the prosthesis, and reinfection.  The patient fully understands these risks and would like to proceed with surgery.  Informed consent obtained.  DESCRIPTION OF PROCEDURE:  After adequate level of anesthesia achieved, the patient was positioned in modified beach-chair position.  Left shoulder correctly identified.  Time-out called.  Sterile prep and drape of the left shoulder and arm performed in usual manner.  Deltopectoral exposure performed with a 10 blade scalpel.  Dissection down through subcutaneous tissues using Bovie.  The deltopectoral interval was identified and divided.  The pectoralis was lifted off the conjoint tendon.  The deltoid was elevated off the humerus.  Subperiosteal dissection performed.  Fluid removed from the shoulder joint and then sent for a stat Gram stain which came back negative.  We then removed all the soft tissue off the humeral neck and around the lateral side of his shoulder.  We took some of the tissue and sent it to pathology. Frozen section negative for any signs of inflammation or infection. Once we had adequate humeral exposure, we removed the humeral cement spacer prosthesis and then cleaned out the humeral shaft with reverse biting curettes.  Next, we brought in C-arm, draped it into the field. We then prepared the humeral shaft reaming first a size 6 reamer and then up to 8 reamer that felt very secure.  We were able to get well distal to the healed fracture site which we could easily see with this  fluoro in multiple planes.  We just wanted to make sure we were down in the distal shaft.  Once we completed that, we directed our attention towards the glenoid.  We had to remove a lot of scar tissue from the plane underneath the conjoint tendon careful not to injure the axillary nerve which we did identify and protect it the remainder of the case. We removed some remnants of the rotator cuff posteriorly.  We took a bunch  of scar tissue off the shelf of the glenoid face with a curette and rongeur.  Once we had 360 degree exposure and no scar tissue remaining down there in the axillary area  we went ahead and prepared the glenoid face with reaming first centering our guidepin low on the glenoid and then reaming for the metaglene.  We then drilled our central peg hole and impacted the metaglene with care towards placing holes for the screws in appropriate positions for the base of the coracoid in the inferior scapular pillar.  We placed 4 screws, 3 of those locked superior, inferior, and anteriorly.  The posterior one was just a unicortical 18-mm nonlocking screw.  Once our screws were in place, we again checked our nerve to make sure it was in good position and not impinged.  We then placed a 42 standard glenosphere and screwed that in position.  We thoroughly irrigated the shoulder throughout with pulse irrigation of 3 L.  Once the glenosphere is in place, we trialed a long stem size 8 cemented humeral prosthesis.  We placed that in about 10 degrees retroversion.  We eventually chose 0 degrees of retroversion which seemed the most stable.  We reduced the shoulder and found some tightness.  We released the remaining rotator cuff off the back and then eliminated the tightness which had decreased the tension as we externally rotated and prevented dislocation. Once we had that fully released and we were pleased with soft tissue balancing, we went ahead and cemented using the gentamicin impregnated cement from DePuy, cemented the stem into place at 0 degrees appropriate height.  We then reduced with a +6.  We were happy with soft tissue balancing.  No instability, no gapping.  The nerve was tight but it was free from the bearing surface and did not seem excessively tight.  The shoulder felt very stable.  We thoroughly irrigated the wound and closed the deltopectoral interval with 0 Vicryl suture followed by 2-0  Vicryl for subcutaneous closure and staples for skin.  A sterile compressive bandage applied.  The patient tolerated the procedure well.     Almedia Balls. Ranell Patrick, M.D.     SRN/MEDQ  D:  09/04/2012  T:  09/05/2012  Job:  [id number]

## 2012-09-06 LAB — GLUCOSE, CAPILLARY: Glucose-Capillary: 149 mg/dL — ABNORMAL HIGH (ref 70–99)

## 2012-09-06 NOTE — Progress Notes (Signed)
Occupational Therapy Treatment Patient Details Name: [name] MRN: [id number] DOB: 1955/12/20 Today's Date: 09/06/2012 Time: 4098-1191 OT Time Calculation (min): 30 min  OT Assessment / Plan / Recommendation Comments on Treatment Session Education completed regarding ADL,sling and ROM. Informed pt tp call Dr. Ranell Patrick in am regarding allowable shoulder ROM and follw up as indicated with therapy. Discussed with Dr. Rennis Chris - see ADL section.    Follow Up Recommendations  Other (comment) (as determined by Dr. Ranell Patrick)    Barriers to Discharge       Equipment Recommendations  None recommended by OT    Recommendations for Other Services    Frequency Min 2X/week   Plan Discharge plan remains appropriate    Precautions / Restrictions Precautions Precautions: Shoulder Type of Shoulder Precautions: in sling with ambulation Precaution Booklet Issued: Yes (comment) Restrictions LUE Weight Bearing: Non weight bearing   Pertinent Vitals/Pain 5/10. Pain meds given    ADL  Upper Body Bathing: Minimal assistance Where Assessed - Upper Body Bathing: Unsupported sitting Upper Body Dressing: Minimal assistance Where Assessed - Upper Body Dressing: Unsupported sitting ADL Comments: Educated pt on dressing and bathing techniques following shoulder precautions. Pt/wife demonstrated understanding.of ADL techniques, sling wearing schedule and donning/doffing of sling. Reviewed exercises for LUE - elbow, wrist hand only at this time. Emphasized importance of keeping LUE in front of body. Discussed exercise with MD on call: due to nondescript order set, informed pt ot call Dr. Ranell Patrick in am to discuss allowable shoulder ROM.Pt verbalized understansing.    OT Diagnosis:    OT Problem List:   OT Treatment Interventions:     OT Goals Acute Rehab OT Goals OT Goal Formulation: With patient Time For Goal Achievement: 09/12/12 Potential to Achieve Goals: Good ADL Goals Additional ADL Goal #1: Pt  will be I with elbow, wrist and hand ROM to maintain range and decrease edema. ADL Goal: Additional Goal #1 - Progress: Met Additional ADL Goal #2: Pt will I'ly verbalize/demonstrate UB ADL adaptations. ADL Goal: Additional Goal #2 - Progress: Met  Visit Information  Last OT Received On: 09/06/12 Assistance Needed: +1                     Mobility  Shoulder Instructions   independent   sling mgnt and positioning   Exercises   elbow, wrist and hand A/AAROM. No shoulder ROM at this time   Balance  independent   End of Session OT - End of Session Activity Tolerance: Patient tolerated treatment well Patient left: in chair;with family/visitor present Nurse Communication: Other (comment) (D/C status)  GO     [name],[name] 09/06/2012, 11:17 AM Luisa Dago, OTR/L  [phone] 09/06/2012

## 2012-09-06 NOTE — Progress Notes (Signed)
[name]  MRN: [id number] DOB/Age: 1955/11/24 56 y.o. Physician: Lynnea Maizes, M.D. 2 Days Post-Op Procedure(s) (LRB): REVERSE SHOULDER ARTHROPLASTY (Left) TOTAL SHOULDER REVISION (Left)  Subjective: Reports minimal pain, anxious to go home Vital Signs Temp:  [97.9 F (36.6 C)-99 F (37.2 C)] 97.9 F (36.6 C) (11/10 0556) Pulse Rate:  [78-96] 78  (11/10 0556) Resp:  [16-17] 16  (11/10 0556) BP: (119-136)/(54-72) 128/72 mmHg (11/10 0556) SpO2:  [94 %-100 %] 100 % (11/10 0556)  Lab Results  Basename 09/05/12 0509  WBC --  HGB 11.1*  HCT 33.2*  PLT --   BMET  Basename 09/05/12 0509  NA 138  K 3.6  CL 101  CO2 23  GLUCOSE 188*  BUN 20  CREATININE 1.30  CALCIUM 8.9   INR  Date Value Range Status  06/06/2011 1.00  0.00 - 1.49 Final     Exam  Dressings dry, to be changed by nursing prior to d/c. Good digital motion and grossly N/V intact LUE.  Plan S/P L RSA, reviewed appropriate post op exercises and activity levels. RX's on chart. D/C home and F/U w Dr. Ranell Patrick, call for appt. [name] M 09/06/2012, 11:12 AM

## 2012-09-07 LAB — BODY FLUID CULTURE

## 2012-09-08 ENCOUNTER — Encounter (HOSPITAL_COMMUNITY): Payer: Self-pay | Admitting: Orthopedic Surgery

## 2012-09-08 NOTE — Discharge Summary (Signed)
Physician Discharge Summary  Patient ID: [name] MRN: [id number] DOB/AGE: [birth year] 56 y.o.  Admit date: 09/04/2012 Discharge date: 09/06/2012  Admission Diagnoses:  Left shoulder endstage osteoarthritis with hx of infection  Discharge Diagnoses:  Same   Surgeries: Procedure(s): REVERSE SHOULDER ARTHROPLASTY TOTAL SHOULDER REVISION on 09/04/2012   Consultants: PT/OT  Discharged Condition: Stable  Hospital Course: [name] is an 56 y.o. male who was admitted 09/04/2012 with a chief complaint of No chief complaint on file. , and found to have a diagnosis of <principal problem not specified>.  They were brought to the operating room on 09/04/2012 and underwent the above named procedures.    The patient had an uncomplicated hospital course and was stable for discharge.  Recent vital signs:  Filed Vitals:   09/06/12 0556  BP: 128/72  Pulse: 78  Temp: 97.9 F (36.6 C)  Resp: 16    Recent laboratory studies:  Results for orders placed during the hospital encounter of 09/04/12  GLUCOSE, CAPILLARY      Component Value Range   Glucose-Capillary 145 (*) 70 - 99 mg/dL  GRAM STAIN      Component Value Range   Specimen Description FLUID SYNOVIAL RIGHT TOE     Special Requests PT ON VANCO FLUID ON SWAB     Gram Stain       Value: RARE WBC PRESENT, PREDOMINANTLY MONONUCLEAR     NO ORGANISMS SEEN     CALLED TO OR 09/04/12 1255 BY K SCHULTZ   Report Status 09/04/2012 FINAL    ANAEROBIC CULTURE      Component Value Range   Specimen Description FLUID SYNOVIAL RIGHT TOE     Special Requests PT ON VANCO FLUID ON SWAB     Gram Stain PENDING     Culture       Value: NO ANAEROBES ISOLATED; CULTURE IN PROGRESS FOR 5 DAYS   Report Status PENDING    BODY FLUID CULTURE      Component Value Range   Specimen Description FLUID SYNOVIAL RIGHT TOE     Special Requests PT ON VANCO FLUID ON SWAB     Gram Stain       Value: RARE WBC PRESENT, PREDOMINANTLY MONONUCLEAR     NO  ORGANISMS SEEN     Performed at [facility] Gram Stain Report Called to,Read Back By and Verified With: Gram Stain Report Called to,Read Back By and Verified With:  TO OR 09/04/12 1255 BY K SCHULTZ   Culture NO GROWTH 3 DAYS     Report Status 09/07/2012 FINAL    GLUCOSE, CAPILLARY      Component Value Range   Glucose-Capillary 166 (*) 70 - 99 mg/dL   Comment 1 Notify RN    HEMOGLOBIN AND HEMATOCRIT, BLOOD      Component Value Range   Hemoglobin 11.1 (*) 13.0 - 17.0 g/dL   HCT 29.5 (*) 28.4 - 13.2 %  BASIC METABOLIC PANEL      Component Value Range   Sodium 138  135 - 145 mEq/L   Potassium 3.6  3.5 - 5.1 mEq/L   Chloride 101  96 - 112 mEq/L   CO2 23  19 - 32 mEq/L   Glucose, Bld 188 (*) 70 - 99 mg/dL   BUN 20  6 - 23 mg/dL   Creatinine, Ser 4.40  0.50 - 1.35 mg/dL   Calcium 8.9  8.4 - 10.2 mg/dL   GFR calc non Af Amer 59 (*) >90 mL/min  GFR calc Af Amer 68 (*) >90 mL/min  GLUCOSE, CAPILLARY      Component Value Range   Glucose-Capillary 179 (*) 70 - 99 mg/dL   Comment 1 Notify RN    GLUCOSE, CAPILLARY      Component Value Range   Glucose-Capillary 215 (*) 70 - 99 mg/dL  GLUCOSE, CAPILLARY      Component Value Range   Glucose-Capillary 164 (*) 70 - 99 mg/dL  GLUCOSE, CAPILLARY      Component Value Range   Glucose-Capillary 249 (*) 70 - 99 mg/dL  GLUCOSE, CAPILLARY      Component Value Range   Glucose-Capillary 234 (*) 70 - 99 mg/dL  GLUCOSE, CAPILLARY      Component Value Range   Glucose-Capillary 185 (*) 70 - 99 mg/dL  GLUCOSE, CAPILLARY      Component Value Range   Glucose-Capillary 149 (*) 70 - 99 mg/dL  GLUCOSE, CAPILLARY      Component Value Range   Glucose-Capillary 179 (*) 70 - 99 mg/dL   Comment 1 Documented in Chart     Comment 2 Notify RN      Discharge Medications:     Medication List     As of 09/08/2012 10:33 AM    TAKE these medications         aspirin EC 81 MG tablet   Take 81 mg by mouth daily.      atorvastatin 20 MG tablet    Commonly known as: LIPITOR   Take 20 mg by mouth at bedtime.      fenofibrate micronized 134 MG capsule   Commonly known as: LOFIBRA   Take 134 mg by mouth daily before breakfast.      gabapentin 300 MG capsule   Commonly known as: NEURONTIN   Take 300 mg by mouth 2 (two) times daily.      hydrochlorothiazide 25 MG tablet   Commonly known as: HYDRODIURIL   Take 12.5 mg by mouth daily with breakfast.      LEVEMIR FLEXPEN 100 UNIT/ML injection   Generic drug: insulin detemir   Inject 50 Units into the skin daily with breakfast.      methocarbamol 500 MG tablet   Commonly known as: ROBAXIN   Take 1 tablet (500 mg total) by mouth 3 (three) times daily as needed.      oxyCODONE-acetaminophen 5-325 MG per tablet   Commonly known as: PERCOCET/ROXICET   Take 1-2 tablets by mouth every 4 (four) hours as needed for pain.      pioglitazone-metformin 15-850 MG per tablet   Commonly known as: ACTOPLUS MET   Take 1 tablet by mouth 2 (two) times daily with a meal.      ramipril 10 MG capsule   Commonly known as: ALTACE   Take 10 mg by mouth daily with breakfast.      senna-docusate 8.6-50 MG per tablet   Commonly known as: Senokot-S   Take 1 tablet by mouth 2 (two) times daily.      sodium-potassium bicarbonate Tbef   Commonly known as: ALKA-SELTZER GOLD   Take 1 tablet by mouth daily as needed.      sulfamethoxazole-trimethoprim 800-160 MG per tablet   Commonly known as: BACTRIM DS   Take 1 tablet by mouth 2 (two) times daily. Been taking since june      vitamin C 500 MG tablet   Commonly known as: ASCORBIC ACID   Take 500 mg by mouth 2 (two) times daily.  Diagnostic Studies: Dg Chest 2 View  08/27/2012  *RADIOLOGY REPORT*  Clinical Data: Hypertension  CHEST - 2 VIEW  Comparison: None.  Findings: Normal mediastinum and heart silhouette.  No effusion, infiltrate, or pneumothorax.  Small nodule projecting over the right hemidiaphragm is likely a nipple shadow.  Internal  fixation of left humerus.  IMPRESSION: No acute cardiopulmonary process.   Original Report Authenticated By: Genevive Bi, M.D.    Dg Shoulder Left  09/04/2012  *RADIOLOGY REPORT*  Clinical Data: Status post left total shoulder arthroplasty.  LEFT SHOULDER - 2+ VIEW  Comparison: 04/24/2012.  Findings: The humeral and glenoid components appear well seated without complicating features.  There is a remote healed humeral shaft fracture.  IMPRESSION: Well seated components of a total left shoulder arthroplasty.   Original Report Authenticated By: Rudie Meyer, M.D.     Disposition: 01-Home or Self Care        Follow-up Information    Call Verlee Rossetti, MD. [phone])    Contact information:   [address], [address] [place] [address] [phone]           Signed: Thea Gist 09/08/2012, 10:33 AM

## 2012-09-09 LAB — ANAEROBIC CULTURE

## 2013-04-20 IMAGING — CR DG CHEST 2V
2 series · 2 of 2 positions shown · non-contrast
Comparison: None.

CLINICAL DATA: Preop testing left shoulder arthroplasty, sleep
apnea, nonsmoker, no chest complaints.

CHEST - 2 VIEW

[view not recorded (1 of 2)]
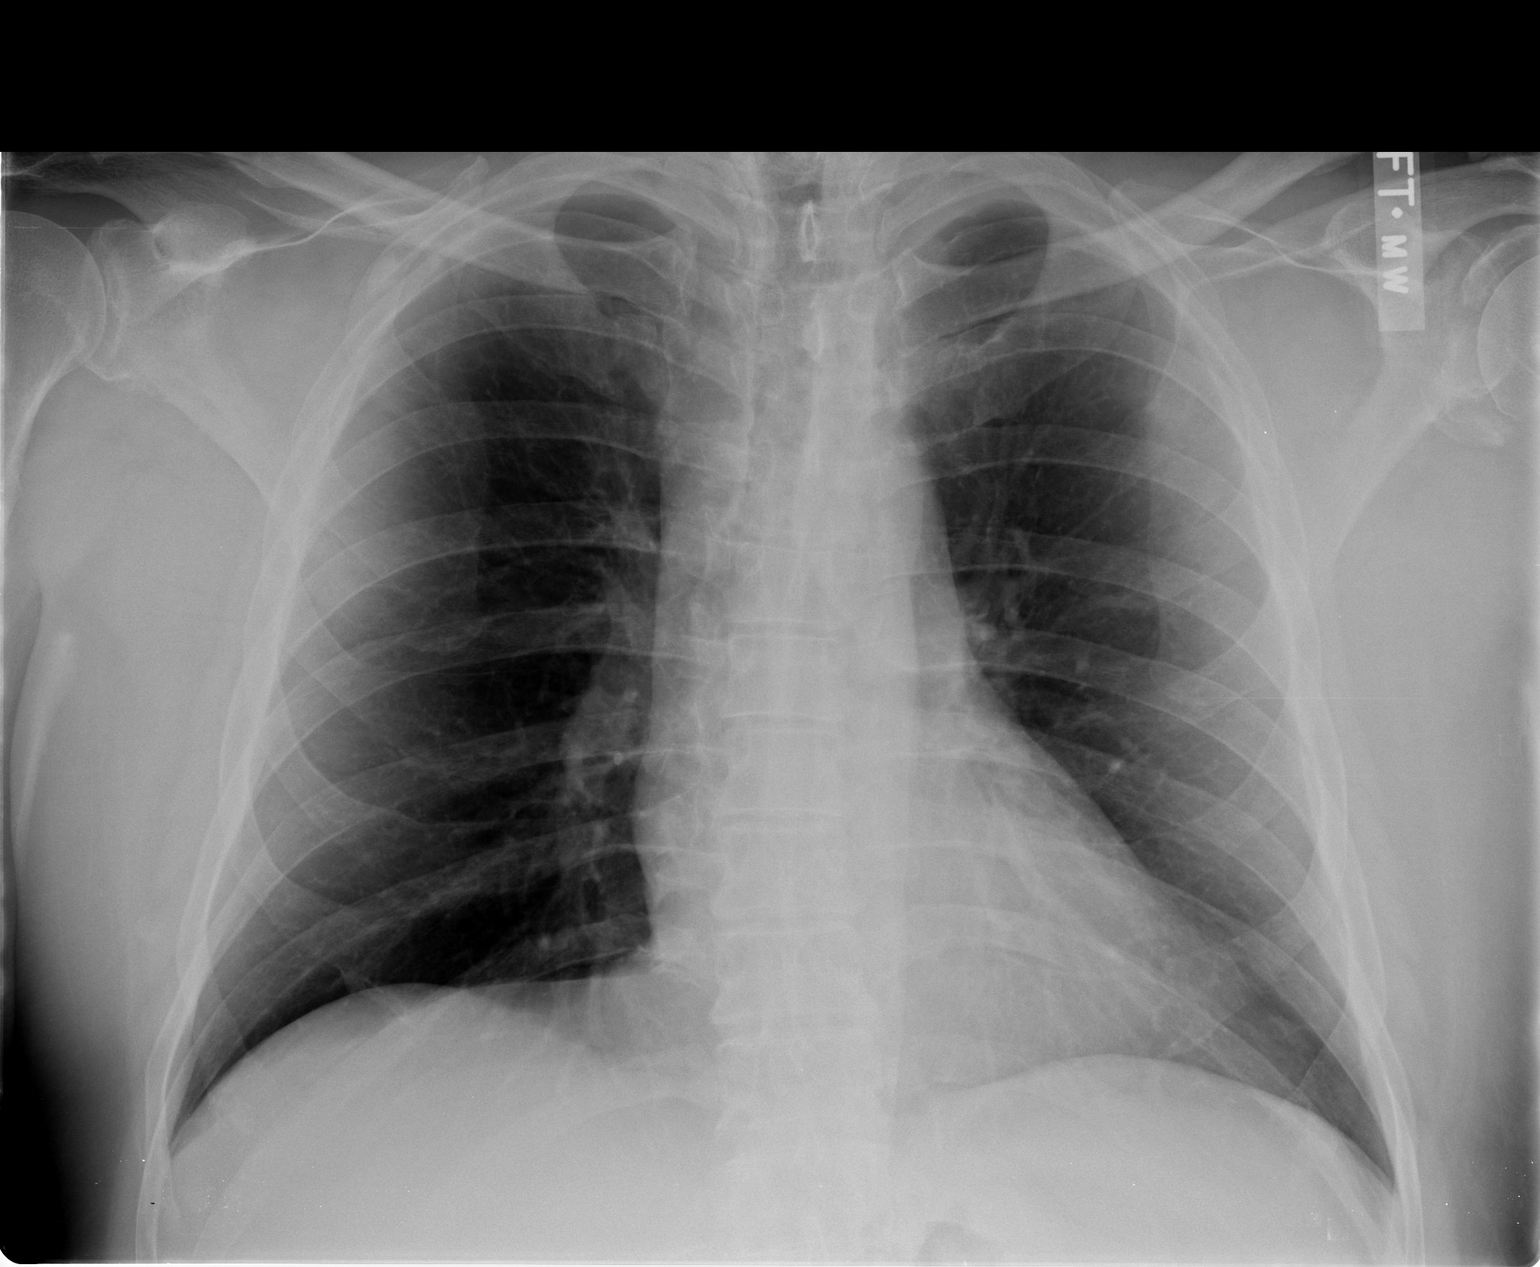

[view not recorded (2 of 2)]
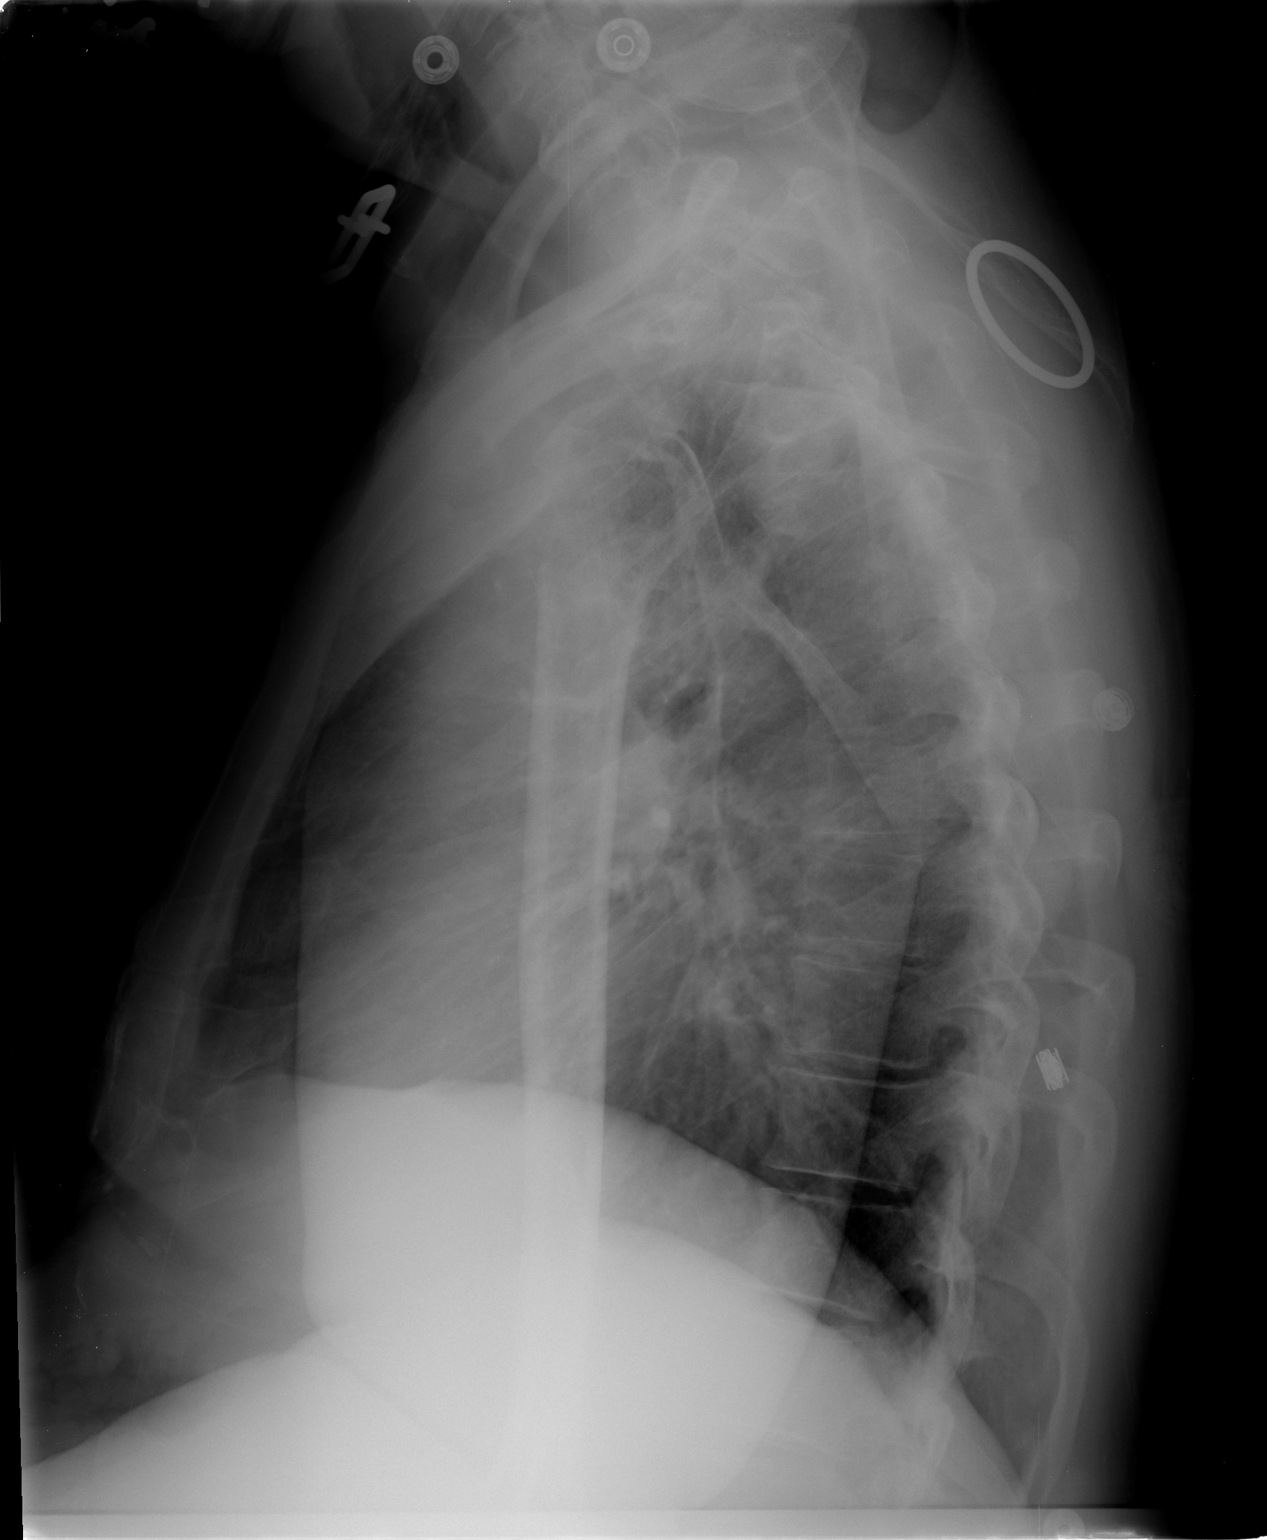

[2 of 2 positions shown; findings below may reference images not displayed]

FINDINGS: Lungs are clear.

Heart size normal.

Mediastinum, hila, pleura appear normal for age.

Partially imaged degenerative change left glenohumeral joint with
osseous structures otherwise unremarkable.
IMPRESSION: No active cardiopulmonary disease.

## 2017-03-28 DIAGNOSIS — I2510 Atherosclerotic heart disease of native coronary artery without angina pectoris: Secondary | ICD-10-CM

## 2017-03-28 HISTORY — DX: Atherosclerotic heart disease of native coronary artery without angina pectoris: I25.10

## 2017-04-07 ENCOUNTER — Encounter: Payer: Self-pay | Admitting: Cardiology

## 2017-04-07 ENCOUNTER — Ambulatory Visit (INDEPENDENT_AMBULATORY_CARE_PROVIDER_SITE_OTHER): Payer: BC Managed Care – PPO | Admitting: Cardiology

## 2017-04-07 VITALS — BP 138/70 | HR 72 | Ht 67.0 in | Wt 251.0 lb

## 2017-04-07 DIAGNOSIS — E1169 Type 2 diabetes mellitus with other specified complication: Secondary | ICD-10-CM | POA: Diagnosis not present

## 2017-04-07 DIAGNOSIS — E785 Hyperlipidemia, unspecified: Secondary | ICD-10-CM

## 2017-04-07 DIAGNOSIS — I200 Unstable angina: Secondary | ICD-10-CM

## 2017-04-07 DIAGNOSIS — Z01818 Encounter for other preprocedural examination: Secondary | ICD-10-CM | POA: Diagnosis not present

## 2017-04-07 DIAGNOSIS — D689 Coagulation defect, unspecified: Secondary | ICD-10-CM

## 2017-04-07 DIAGNOSIS — Z794 Long term (current) use of insulin: Secondary | ICD-10-CM

## 2017-04-07 DIAGNOSIS — I10 Essential (primary) hypertension: Secondary | ICD-10-CM | POA: Diagnosis not present

## 2017-04-07 DIAGNOSIS — Z6841 Body Mass Index (BMI) 40.0 and over, adult: Secondary | ICD-10-CM

## 2017-04-07 DIAGNOSIS — E118 Type 2 diabetes mellitus with unspecified complications: Secondary | ICD-10-CM | POA: Diagnosis not present

## 2017-04-07 MED ORDER — ATORVASTATIN CALCIUM 40 MG PO TABS: 40.0000 mg | ORAL_TABLET | Freq: Every day | ORAL | 6 refills | Status: DC

## 2017-04-07 MED ORDER — METOPROLOL TARTRATE 25 MG PO TABS: 25.0000 mg | ORAL_TABLET | Freq: Two times a day (BID) | ORAL | 6 refills | Status: DC

## 2017-04-07 NOTE — Patient Instructions (Signed)
   [facility] [address] [place] [address] Dept: [phone] Loc: [place]  04/07/2017  You are scheduled for a Cardiac Catheterization on Wednesday, June 13 with Dr. [name].  1. Please arrive at the [facility] (Main Entrance A) at [facility]: [address] [place], [place] [address] at 6:30 AM (two hours before your procedure to ensure your preparation). Free valet parking service is available.   Special note: Every effort is made to have your procedure done on time. Please understand that emergencies sometimes delay scheduled procedures.  2. Diet: Do not eat or drink anything after midnight prior to your procedure except sips of water to take medications.  3. Labs: PT ,PTT DO TODAY  4. Medication instructions in preparation for your procedure:  DO NOT TAKE ACTOPLUS- MET  THE NIGHT BEFORE OR DAY OF PROCEDURES  DO NOT TAKE INSULIN THE MORNING OF PROCEDURE  On the morning of your procedure, take your Aspirin 81 MG and any morning medicines NOT listed above.  You may use sips of water.  5. Plan for one night stay--bring personal belongings. 6. Bring a current list of your medications and current insurance cards. 7. You MUST have a responsible person to drive you home. 8. Someone MUST be with you the first 24 hours after you arrive home or your discharge will be delayed. 9. Please wear clothes that are easy to get on and off and wear slip-on shoes.  Thank you for allowing [place] to care for you!   -- [facility]    MEDICATION ADDITION  --INCREASE ATORVASTATIN TO 40 MG ONE TABLET AT BEDTIME  ---START METOPROLOL 25 MG ONE TABLET TWICE A DAY  LABS  PT ,PTT

## 2017-04-07 NOTE — Assessment & Plan Note (Signed)
Borderline control today on ACE inhibitor and HCTZ. I'm adding beta blocker.

## 2017-04-07 NOTE — Assessment & Plan Note (Signed)
We need to clarify his coronary artery disease status, but he will deathly need to have education on dietary modification and exercise regimen. He would potentially benefit from cardiac rehabilitation if he does have PCI.

## 2017-04-07 NOTE — Assessment & Plan Note (Addendum)
Symptoms of progressively worsening exertional dyspnea with chest tightness very concerning for progressive/unstable crescendo angina. He has multiple risk factors including diabetes, hypertension, obesity and hyperlipidemia all of which make up a sense of the metabolic syndrome. With this new onset level of symptoms, I think the best course of action is to actually proceed with cardiac catheterization which will plan for this week.  He is artery had labs done at his PCPs office as noted above.  Plan: Schedule Left Heart Catheterization with Coronary Angiogram And Possible PCI  Start metoprolol 25 mg twice a day.  Increase atorvastatin 40 mg daily for endothelial stabilization  I've asked that he stay relatively activity free until this procedure can be done.  Hold ACTOplus Met preop as well as reduced dose of long- acting insulin

## 2017-04-07 NOTE — Assessment & Plan Note (Signed)
Most likely the patient will have signs of coronary artery disease. I'm increasing his atorvastatin 40 mg daily along with a low fiber. His most recent labs were not too far out of goal with LDL 74 HDL 49 and triglycerides 172.  I would continue both for now as he is not having any myalgia symptoms.

## 2017-04-07 NOTE — Assessment & Plan Note (Signed)
Insulin-dependent diabetes with neuropathy type pains - monitored and managed by PCP
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
## 2017-04-08 ENCOUNTER — Telehealth: Payer: Self-pay

## 2017-04-08 LAB — PT AND PTT
INR: 0.9 (ref 0.8–1.2)
PROTHROMBIN TIME: 9.8 s (ref 9.1–12.0)
aPTT: 25 s (ref 24–33)

## 2017-04-08 NOTE — Telephone Encounter (Signed)
Patient contacted pre-catheterization at [facility] scheduled for: 04/09/2017 @ 0830 Verified arrival time and place: yes-arrival time 0630 Confirmed AM meds to be taken pre-cath with sip of water: Notified Pt to hold HCTZ and Levemir day of procedure.  Pt has taken his Actoplus this AM already, notified Pt he will need to hold this 48 hours s/p procedure.  Notified Pt when he is discharged from hospital they will tell him exactly when he can take this again.  Pt indicates understanding of med instruction. Confirmed patient has responsible person to drive home post procedure and observe patient for 24 hours:  Yes, wife Addl concerns:  None noted.  Notified to call this nurse if any further questions.

## 2017-04-09 ENCOUNTER — Encounter (HOSPITAL_COMMUNITY): Payer: Self-pay | Admitting: Cardiology

## 2017-04-09 ENCOUNTER — Encounter (HOSPITAL_COMMUNITY)
Admission: RE | Disposition: A | Discharge status: Home / Self Care | Payer: Self-pay | Source: Ambulatory Visit | Attending: Cardiology

## 2017-04-09 ENCOUNTER — Ambulatory Visit (HOSPITAL_COMMUNITY)
Admission: RE | Admit: 2017-04-09 | Discharge: 2017-04-09 | Disposition: A | Discharge status: Home / Self Care | Payer: BC Managed Care – PPO | Source: Ambulatory Visit | Attending: Cardiology | Admitting: Cardiology

## 2017-04-09 DIAGNOSIS — E1140 Type 2 diabetes mellitus with diabetic neuropathy, unspecified: Secondary | ICD-10-CM | POA: Diagnosis not present

## 2017-04-09 DIAGNOSIS — I25110 Atherosclerotic heart disease of native coronary artery with unstable angina pectoris: Secondary | ICD-10-CM | POA: Diagnosis not present

## 2017-04-09 DIAGNOSIS — Z7982 Long term (current) use of aspirin: Secondary | ICD-10-CM | POA: Insufficient documentation

## 2017-04-09 DIAGNOSIS — Z01818 Encounter for other preprocedural examination: Secondary | ICD-10-CM

## 2017-04-09 DIAGNOSIS — Z85828 Personal history of other malignant neoplasm of skin: Secondary | ICD-10-CM | POA: Insufficient documentation

## 2017-04-09 DIAGNOSIS — Z79899 Other long term (current) drug therapy: Secondary | ICD-10-CM | POA: Insufficient documentation

## 2017-04-09 DIAGNOSIS — I10 Essential (primary) hypertension: Secondary | ICD-10-CM | POA: Diagnosis not present

## 2017-04-09 DIAGNOSIS — G4733 Obstructive sleep apnea (adult) (pediatric): Secondary | ICD-10-CM | POA: Diagnosis not present

## 2017-04-09 DIAGNOSIS — Z6839 Body mass index (BMI) 39.0-39.9, adult: Secondary | ICD-10-CM | POA: Diagnosis not present

## 2017-04-09 DIAGNOSIS — Z91048 Other nonmedicinal substance allergy status: Secondary | ICD-10-CM | POA: Insufficient documentation

## 2017-04-09 DIAGNOSIS — E785 Hyperlipidemia, unspecified: Secondary | ICD-10-CM | POA: Diagnosis not present

## 2017-04-09 DIAGNOSIS — R079 Chest pain, unspecified: Secondary | ICD-10-CM | POA: Diagnosis not present

## 2017-04-09 DIAGNOSIS — Z880 Allergy status to penicillin: Secondary | ICD-10-CM | POA: Diagnosis not present

## 2017-04-09 DIAGNOSIS — I200 Unstable angina: Secondary | ICD-10-CM | POA: Diagnosis present

## 2017-04-09 DIAGNOSIS — Z794 Long term (current) use of insulin: Secondary | ICD-10-CM | POA: Insufficient documentation

## 2017-04-09 DIAGNOSIS — E1169 Type 2 diabetes mellitus with other specified complication: Secondary | ICD-10-CM | POA: Diagnosis present

## 2017-04-09 DIAGNOSIS — R0602 Shortness of breath: Secondary | ICD-10-CM | POA: Diagnosis not present

## 2017-04-09 DIAGNOSIS — Z96612 Presence of left artificial shoulder joint: Secondary | ICD-10-CM | POA: Diagnosis not present

## 2017-04-09 DIAGNOSIS — E118 Type 2 diabetes mellitus with unspecified complications: Secondary | ICD-10-CM

## 2017-04-09 DIAGNOSIS — Z6841 Body Mass Index (BMI) 40.0 and over, adult: Secondary | ICD-10-CM

## 2017-04-09 HISTORY — PX: LEFT HEART CATH AND CORONARY ANGIOGRAPHY: CATH118249

## 2017-04-09 LAB — GLUCOSE, CAPILLARY
GLUCOSE-CAPILLARY: 197 mg/dL — AB (ref 65–99)
GLUCOSE-CAPILLARY: 170 mg/dL — AB (ref 65–99)

## 2017-04-09 SURGERY — LEFT HEART CATH AND CORONARY ANGIOGRAPHY
Anesthesia: LOCAL

## 2017-04-09 MED ORDER — MIDAZOLAM HCL 2 MG/2ML IJ SOLN
INTRAMUSCULAR | Status: AC
  Filled 2017-04-09: qty 2

## 2017-04-09 MED ORDER — MIDAZOLAM HCL 2 MG/2ML IJ SOLN
INTRAMUSCULAR | Status: DC | PRN
  Administered 2017-04-09: 2 mg via INTRAVENOUS

## 2017-04-09 MED ORDER — IOPAMIDOL (ISOVUE-370) INJECTION 76%
INTRAVENOUS | Status: DC | PRN
  Administered 2017-04-09: 85 mL via INTRA_ARTERIAL

## 2017-04-09 MED ORDER — VERAPAMIL HCL 2.5 MG/ML IV SOLN
INTRAVENOUS | Status: DC | PRN
  Administered 2017-04-09: 10 mL via INTRA_ARTERIAL

## 2017-04-09 MED ORDER — ONDANSETRON HCL 4 MG/2ML IJ SOLN: 4.0000 mg | Freq: Four times a day (QID) | INTRAMUSCULAR | Status: DC | PRN

## 2017-04-09 MED ORDER — HEPARIN (PORCINE) IN NACL 2-0.9 UNIT/ML-% IJ SOLN
INTRAMUSCULAR | Status: AC | PRN
  Administered 2017-04-09: 1000 mL

## 2017-04-09 MED ORDER — SODIUM CHLORIDE 0.9 % IV SOLN: 250.0000 mL | INTRAVENOUS | Status: DC | PRN

## 2017-04-09 MED ORDER — SODIUM CHLORIDE 0.9% FLUSH: 3.0000 mL | INTRAVENOUS | Status: DC | PRN

## 2017-04-09 MED ORDER — FENTANYL CITRATE (PF) 100 MCG/2ML IJ SOLN
INTRAMUSCULAR | Status: DC | PRN
  Administered 2017-04-09: 50 ug via INTRAVENOUS

## 2017-04-09 MED ORDER — ACETAMINOPHEN 325 MG PO TABS: 650.0000 mg | ORAL_TABLET | ORAL | Status: DC | PRN

## 2017-04-09 MED ORDER — LIDOCAINE HCL 1 % IJ SOLN
INTRAMUSCULAR | Status: AC
  Filled 2017-04-09: qty 20

## 2017-04-09 MED ORDER — SODIUM CHLORIDE 0.9 % IV SOLN
INTRAVENOUS | Status: DC
Start: 2017-04-09 — End: 2017-04-09
  Administered 2017-04-09: 07:00:00 via INTRAVENOUS

## 2017-04-09 MED ORDER — LIDOCAINE HCL (PF) 1 % IJ SOLN
INTRAMUSCULAR | Status: DC | PRN
  Administered 2017-04-09: 2 mL

## 2017-04-09 MED ORDER — FENTANYL CITRATE (PF) 100 MCG/2ML IJ SOLN
INTRAMUSCULAR | Status: AC
  Filled 2017-04-09: qty 2

## 2017-04-09 MED ORDER — VERAPAMIL HCL 2.5 MG/ML IV SOLN
INTRAVENOUS | Status: AC
  Filled 2017-04-09: qty 2

## 2017-04-09 MED ORDER — SODIUM CHLORIDE 0.9 % IV SOLN: INTRAVENOUS | Status: DC

## 2017-04-09 MED ORDER — SODIUM CHLORIDE 0.9% FLUSH: 3.0000 mL | Freq: Two times a day (BID) | INTRAVENOUS | Status: DC

## 2017-04-09 MED ORDER — HEPARIN SODIUM (PORCINE) 1000 UNIT/ML IJ SOLN
INTRAMUSCULAR | Status: DC | PRN
  Administered 2017-04-09: 6000 [IU] via INTRAVENOUS

## 2017-04-09 MED ORDER — HEPARIN SODIUM (PORCINE) 1000 UNIT/ML IJ SOLN
INTRAMUSCULAR | Status: AC
  Filled 2017-04-09: qty 1

## 2017-04-09 SURGICAL SUPPLY — 16 items
CATH DIAG 6FR PIGTAIL ANGLED (CATHETERS) ×4 IMPLANT
CATH INFINITI 5FR ANG PIGTAIL (CATHETERS) ×2 IMPLANT
CATH INFINITI 6F AL1 (CATHETERS) ×4 IMPLANT
CATH OPTITORQUE TIG 4.0 5F (CATHETERS) ×2 IMPLANT
CATH S G BIP PACING (SET/KITS/TRAYS/PACK) ×2 IMPLANT
DEVICE RAD COMP TR BAND LRG (VASCULAR PRODUCTS) ×2 IMPLANT
GLIDESHEATH SLEND A-KIT 6F 22G (SHEATH) ×2 IMPLANT
GUIDEWIRE INQWIRE 1.5J.035X260 (WIRE) ×1 IMPLANT
INQWIRE 1.5J .035X260CM (WIRE) ×2
KIT HEART LEFT (KITS) ×2 IMPLANT
PACK CARDIAC CATHETERIZATION (CUSTOM PROCEDURE TRAY) ×2 IMPLANT
SYR MEDRAD MARK V 150ML (SYRINGE) ×2 IMPLANT
TRANSDUCER W/STOPCOCK (MISCELLANEOUS) ×2 IMPLANT
TUBING CIL FLEX 10 FLL-RA (TUBING) ×2 IMPLANT
WIRE EMERALD 3MM-J .035X150CM (WIRE) ×2 IMPLANT
WIRE EMERALD ST .035X260CM (WIRE) ×4 IMPLANT
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
## 2017-04-09 NOTE — Interval H&P Note (Signed)
History and Physical Interval Note:  04/09/2017 8:08 AM  [name]  has presented today for surgery, with the diagnosis of progressive angina  The various methods of treatment have been discussed with the patient and family. After consideration of risks, benefits and other options for treatment, the patient has consented to  Procedure(s): Left Heart Cath and Coronary Angiography (N/A) with possible Percutaneous Coronary Intervention  as a surgical intervention .  The patient's history has been reviewed, patient examined, no change in status, stable for surgery.  I  have reviewed the patient's chart and labs.  Questions were answered to the patient's satisfaction.     Cath Lab Visit (complete for each Cath Lab visit)  Clinical Evaluation Leading to the Procedure:   ACS: No.  Non-ACS:    Anginal Classification: CCS III  Anti-ischemic medical therapy: Minimal Therapy (1 class of medications)  Non-Invasive Test Results: No non-invasive testing performed  Prior CABG: No previous CABG   [name]

## 2017-04-09 NOTE — Discharge Instructions (Signed)

## 2017-04-14 ENCOUNTER — Telehealth: Payer: Self-pay

## 2017-04-14 NOTE — Telephone Encounter (Signed)
Patient contacted regarding discharge from [facility] s/p catheterization 04/09/2017 Patient understands to follow up with provider: Alanda Amass office 04/21/2017 @ 930

## 2017-04-17 ENCOUNTER — Telehealth: Payer: Self-pay | Admitting: Physician Assistant

## 2017-04-17 NOTE — Telephone Encounter (Signed)
Received records from [facility] for appointment on 04/21/17 with Rosaria Ferries, PA.  Records put with Rhonda's schedule for 04/21/17. lp

## 2017-04-21 ENCOUNTER — Ambulatory Visit (INDEPENDENT_AMBULATORY_CARE_PROVIDER_SITE_OTHER): Payer: BC Managed Care – PPO | Admitting: Physician Assistant

## 2017-04-21 VITALS — BP 131/63 | HR 70 | Ht 67.0 in | Wt 250.4 lb

## 2017-04-21 DIAGNOSIS — I2510 Atherosclerotic heart disease of native coronary artery without angina pectoris: Secondary | ICD-10-CM

## 2017-04-21 DIAGNOSIS — E1169 Type 2 diabetes mellitus with other specified complication: Secondary | ICD-10-CM

## 2017-04-21 DIAGNOSIS — E785 Hyperlipidemia, unspecified: Secondary | ICD-10-CM | POA: Diagnosis not present

## 2017-04-21 DIAGNOSIS — I10 Essential (primary) hypertension: Secondary | ICD-10-CM | POA: Diagnosis not present

## 2017-04-21 DIAGNOSIS — R0609 Other forms of dyspnea: Secondary | ICD-10-CM | POA: Diagnosis not present
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
## 2017-04-21 NOTE — Patient Instructions (Addendum)
Medication Instructions:  Your physician recommends that you continue on your current medications as directed. Please refer to the Current Medication list given to you today.  Labwork: none  Testing/Procedures: none  Follow-Up: Your physician wants you to follow-up in: 1 year ov  You will receive a reminder letter in the mail two months in advance. If you don't receive a letter, please call our office to schedule the follow-up appointment.  Any Other Special Instructions Will Be Listed Below (If Applicable). [place] NUMBER [phone]  If you need a refill on your cardiac medications before your next appointment, please call your pharmacy.

## 2017-10-14 ENCOUNTER — Other Ambulatory Visit: Payer: Self-pay

## 2017-10-14 MED ORDER — METOPROLOL TARTRATE 25 MG PO TABS: 25.0000 mg | ORAL_TABLET | Freq: Two times a day (BID) | ORAL | 2 refills | Status: DC

## 2017-10-17 ENCOUNTER — Other Ambulatory Visit: Payer: Self-pay

## 2017-11-03 ENCOUNTER — Other Ambulatory Visit: Payer: Self-pay

## 2017-11-03 MED ORDER — METOPROLOL TARTRATE 25 MG PO TABS: 25.0000 mg | ORAL_TABLET | Freq: Two times a day (BID) | ORAL | 1 refills | Status: DC

## 2017-11-03 MED ORDER — ATORVASTATIN CALCIUM 40 MG PO TABS: 40.0000 mg | ORAL_TABLET | Freq: Every day | ORAL | 1 refills | Status: DC

## 2017-11-03 NOTE — Telephone Encounter (Signed)
Received refill request for Atorvastatin 40 mg. Patient's record has both Atorvastatin 40 mg and 20 mg. Called patient to inquire as to which dose patient actually takes.   Spoke with patient who gave verbal okay to speak with wife. She confirmed Atorvastatin 40 mg and requested a refill of metoprolol as well. Rx(s) sent to patient's preferred pharmacy electronically.

## 2018-04-24 ENCOUNTER — Other Ambulatory Visit: Payer: Self-pay | Admitting: Cardiology

## 2018-05-10 ENCOUNTER — Other Ambulatory Visit: Payer: Self-pay | Admitting: Cardiology

## 2018-05-18 ENCOUNTER — Telehealth: Payer: Self-pay | Admitting: Physician Assistant

## 2018-05-18 MED ORDER — METOPROLOL TARTRATE 25 MG PO TABS: 25.0000 mg | ORAL_TABLET | Freq: Two times a day (BID) | ORAL | 2 refills | Status: DC

## 2018-05-18 NOTE — Telephone Encounter (Signed)
New Message:         *STAT* If patient is at the pharmacy, call can be transferred to refill team.   1. Which medications need to be refilled? (please list name of each medication and dose if known) metoprolol tartrate (LOPRESSOR) 25 MG tablet  2. Which pharmacy/location (including street and city if local pharmacy) is medication to be sent to?[id number] - [place], [place]  3. Do they need a 30 day or 90 day supply? [place]

## 2018-06-23 ENCOUNTER — Other Ambulatory Visit: Payer: Self-pay | Admitting: Cardiology

## 2018-06-23 NOTE — Telephone Encounter (Signed)
Rx sent to pharmacy   

## 2018-07-27 ENCOUNTER — Encounter: Payer: Self-pay | Admitting: Cardiology

## 2018-07-27 ENCOUNTER — Ambulatory Visit: Payer: BC Managed Care – PPO | Admitting: Cardiology

## 2018-07-27 VITALS — BP 149/76 | HR 62 | Ht 68.0 in | Wt 263.4 lb

## 2018-07-27 DIAGNOSIS — E785 Hyperlipidemia, unspecified: Secondary | ICD-10-CM

## 2018-07-27 DIAGNOSIS — E1169 Type 2 diabetes mellitus with other specified complication: Secondary | ICD-10-CM

## 2018-07-27 DIAGNOSIS — I10 Essential (primary) hypertension: Secondary | ICD-10-CM

## 2018-07-27 DIAGNOSIS — Z6841 Body Mass Index (BMI) 40.0 and over, adult: Secondary | ICD-10-CM

## 2018-07-27 MED ORDER — METOPROLOL TARTRATE 25 MG PO TABS: 25.0000 mg | ORAL_TABLET | Freq: Two times a day (BID) | ORAL | 3 refills | Status: DC

## 2018-07-27 MED ORDER — ATORVASTATIN CALCIUM 40 MG PO TABS: 40.0000 mg | ORAL_TABLET | Freq: Every day | ORAL | 3 refills | Status: DC
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
## 2018-07-27 NOTE — Patient Instructions (Signed)
No medication changes at present    Your physician wants you to follow-up in 12 month with Dr  Ellyn Hack. You will receive a reminder letter in the mail two months in advance. If you don't receive a letter, please call our office to schedule the follow-up appointment.    If you need a refill on your cardiac medications before your next appointment, please call your pharmacy.

## 2018-07-27 NOTE — Assessment & Plan Note (Signed)
In combination with hypertension and diabetes, this equals metabolic syndrome.  He said that he keeps gaining weight when he adjust his insulin dose to higher dosing.  Discussed dietary modifications and continue to increase exercise. May benefit from nutrition counseling.

## 2018-07-27 NOTE — Assessment & Plan Note (Signed)
LDL is great to 71 with current dose of atorvastatin plus fenofibrate.  No myalgias. Continue current medications for now.  Monitor for signs of side effects.
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
## 2018-08-19 ENCOUNTER — Other Ambulatory Visit: Payer: Self-pay | Admitting: Cardiology

## 2018-10-02 ENCOUNTER — Other Ambulatory Visit: Payer: Self-pay | Admitting: Cardiology

## 2018-10-07 ENCOUNTER — Other Ambulatory Visit: Payer: Self-pay

## 2018-10-07 MED ORDER — ATORVASTATIN CALCIUM 40 MG PO TABS: 40.0000 mg | ORAL_TABLET | Freq: Every day | ORAL | 3 refills | Status: DC

## 2019-05-05 ENCOUNTER — Other Ambulatory Visit: Payer: Self-pay | Admitting: Cardiology

## 2019-05-25 ENCOUNTER — Other Ambulatory Visit: Payer: Self-pay | Admitting: Cardiology

## 2019-09-14 ENCOUNTER — Other Ambulatory Visit: Payer: Self-pay

## 2019-09-14 ENCOUNTER — Ambulatory Visit (INDEPENDENT_AMBULATORY_CARE_PROVIDER_SITE_OTHER): Payer: Medicare Other | Admitting: Cardiology

## 2019-09-14 ENCOUNTER — Encounter: Payer: Self-pay | Admitting: Cardiology

## 2019-09-14 VITALS — BP 153/81 | HR 62 | Temp 96.8°F | Ht 66.5 in | Wt 267.2 lb

## 2019-09-14 DIAGNOSIS — E118 Type 2 diabetes mellitus with unspecified complications: Secondary | ICD-10-CM

## 2019-09-14 DIAGNOSIS — E785 Hyperlipidemia, unspecified: Secondary | ICD-10-CM

## 2019-09-14 DIAGNOSIS — I10 Essential (primary) hypertension: Secondary | ICD-10-CM | POA: Diagnosis not present

## 2019-09-14 DIAGNOSIS — I2510 Atherosclerotic heart disease of native coronary artery without angina pectoris: Secondary | ICD-10-CM

## 2019-09-14 DIAGNOSIS — Z6841 Body Mass Index (BMI) 40.0 and over, adult: Secondary | ICD-10-CM

## 2019-09-14 DIAGNOSIS — E1169 Type 2 diabetes mellitus with other specified complication: Secondary | ICD-10-CM

## 2019-09-14 MED ORDER — ATORVASTATIN CALCIUM 40 MG PO TABS: 40.0000 mg | ORAL_TABLET | Freq: Every day | ORAL | 3 refills | Status: DC

## 2019-09-14 MED ORDER — METOPROLOL TARTRATE 25 MG PO TABS: 25.0000 mg | ORAL_TABLET | Freq: Two times a day (BID) | ORAL | 3 refills | Status: DC
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
## 2019-09-14 NOTE — Patient Instructions (Signed)
Medication Instructions:  No chagges *If you need a refill on your cardiac medications before your next appointment, please call your pharmacy*  Lab Work: Not needed  Testing/Procedures: NOT NEDED  Follow-Up: At [facility], you and your health needs are our priority.  As part of our continuing mission to provide you with exceptional heart care, we have created designated Provider Care Teams.  These Care Teams include your primary Cardiologist (physician) and Advanced Practice Providers (APPs -  Physician Assistants and Nurse Practitioners) who all work together to provide you with the care you need, when you need it.  Your next appointment:   12 months  The format for your next appointment:   In Person  Provider:   [name], MD  Other Instructions   Keep active - Lose weight  , Exercise

## 2019-09-15 ENCOUNTER — Encounter: Payer: Self-pay | Admitting: Cardiology

## 2019-09-15 DIAGNOSIS — I2510 Atherosclerotic heart disease of native coronary artery without angina pectoris: Secondary | ICD-10-CM | POA: Insufficient documentation

## 2019-09-15 NOTE — Assessment & Plan Note (Signed)
LDL stable at 71 on current dose of atorvastatin and fenofibrate.  Tolerating well.  Monitor for side effects with low threshold to convert to rosuvastatin.  Again weight loss and exercise will help.  Also additional glycemic control will help with hypertriglyceridemia.Marland Kitchen

## 2019-09-15 NOTE — Assessment & Plan Note (Signed)
To visit his MRI with increased weight.  He does not seem to be getting the message.  I talked at the importance of staying active and watching his diet.  I think he probably would benefit from being referred to a dietitian that will be local for him.  Weight loss would be important for help with reduction in his overall cardiovascular risk as well as diabetes and blood pressure. With morbid obesity diabetes and hypertension, he has equivalent of metabolic syndrome combined with dyslipidemia (triglycerides 201)

## 2019-09-15 NOTE — Assessment & Plan Note (Signed)
Blood pressure is little high today, but he is somewhat anxious and scared.  This seems to be the same when he comes into the clinic visit.  He says that usually when he sees his PCP the pressures are usually controlled.  Plan for now will be to continue current meds but low threshold to convert from Lopressor to carvedilol 6.25 twice daily.  Return for follow-up of his PE soon.  Hopefully his drive interval the clinic will be less stressful than it was today and would get a better assessment of his true BP.Marland Kitchen

## 2019-09-15 NOTE — Assessment & Plan Note (Signed)
Currently on insulin for his diabetes.  Not on Metformin.  Consider SGLT2 inhibitor if indicated for additional glycemic control.  A1c is only 7.5.  There is definite cardiovascular benefit for use of SGLT2 inhibitor or GLP1 agonist.

## 2019-09-15 NOTE — Assessment & Plan Note (Signed)
Moderate diagonal disease only.  Was a small caliber vessel.  Not amenable for PCI.  Overall diffuse mild to moderate disease.  He is also at high risk for microvascular disease which could lead to angina, dyspnea/cardiomyopathy.  Continue metabolic syndrome management is crucial.  He is weight loss, better glycemic and blood pressure control to go along with his control of lipids.  Notes this is can be related to his increasing his level of activity and exercise to lose weight.

## 2020-03-17 DIAGNOSIS — G4733 Obstructive sleep apnea (adult) (pediatric): Secondary | ICD-10-CM | POA: Diagnosis not present

## 2020-03-31 DIAGNOSIS — G4733 Obstructive sleep apnea (adult) (pediatric): Secondary | ICD-10-CM | POA: Diagnosis not present

## 2020-04-17 DIAGNOSIS — G4733 Obstructive sleep apnea (adult) (pediatric): Secondary | ICD-10-CM | POA: Diagnosis not present

## 2020-06-05 DIAGNOSIS — E113393 Type 2 diabetes mellitus with moderate nonproliferative diabetic retinopathy without macular edema, bilateral: Secondary | ICD-10-CM | POA: Diagnosis not present

## 2020-07-19 DIAGNOSIS — E782 Mixed hyperlipidemia: Secondary | ICD-10-CM | POA: Diagnosis not present

## 2020-07-19 DIAGNOSIS — E1140 Type 2 diabetes mellitus with diabetic neuropathy, unspecified: Secondary | ICD-10-CM | POA: Diagnosis not present

## 2020-07-19 DIAGNOSIS — I10 Essential (primary) hypertension: Secondary | ICD-10-CM | POA: Diagnosis not present

## 2020-07-20 DIAGNOSIS — Z1331 Encounter for screening for depression: Secondary | ICD-10-CM | POA: Diagnosis not present

## 2020-07-20 DIAGNOSIS — K219 Gastro-esophageal reflux disease without esophagitis: Secondary | ICD-10-CM | POA: Diagnosis not present

## 2020-07-20 DIAGNOSIS — I2510 Atherosclerotic heart disease of native coronary artery without angina pectoris: Secondary | ICD-10-CM | POA: Diagnosis not present

## 2020-07-20 DIAGNOSIS — M160 Bilateral primary osteoarthritis of hip: Secondary | ICD-10-CM | POA: Diagnosis not present

## 2020-07-20 DIAGNOSIS — Z139 Encounter for screening, unspecified: Secondary | ICD-10-CM | POA: Diagnosis not present

## 2020-07-20 DIAGNOSIS — I10 Essential (primary) hypertension: Secondary | ICD-10-CM | POA: Diagnosis not present

## 2020-07-20 DIAGNOSIS — Z9181 History of falling: Secondary | ICD-10-CM | POA: Diagnosis not present

## 2020-07-20 DIAGNOSIS — E1140 Type 2 diabetes mellitus with diabetic neuropathy, unspecified: Secondary | ICD-10-CM | POA: Diagnosis not present

## 2020-07-20 DIAGNOSIS — E782 Mixed hyperlipidemia: Secondary | ICD-10-CM | POA: Diagnosis not present

## 2020-08-10 DIAGNOSIS — G4733 Obstructive sleep apnea (adult) (pediatric): Secondary | ICD-10-CM | POA: Diagnosis not present

## 2020-08-23 DIAGNOSIS — N200 Calculus of kidney: Secondary | ICD-10-CM | POA: Diagnosis not present

## 2020-08-23 DIAGNOSIS — N401 Enlarged prostate with lower urinary tract symptoms: Secondary | ICD-10-CM | POA: Diagnosis not present

## 2020-10-12 ENCOUNTER — Other Ambulatory Visit: Payer: Self-pay

## 2020-10-12 ENCOUNTER — Telehealth: Payer: Self-pay | Admitting: Radiology

## 2020-10-12 ENCOUNTER — Ambulatory Visit: Payer: Medicare PPO | Admitting: Cardiology

## 2020-10-12 ENCOUNTER — Encounter: Payer: Self-pay | Admitting: Cardiology

## 2020-10-12 VITALS — BP 160/78 | HR 70 | Ht 68.0 in | Wt 275.2 lb

## 2020-10-12 DIAGNOSIS — E1169 Type 2 diabetes mellitus with other specified complication: Secondary | ICD-10-CM | POA: Diagnosis not present

## 2020-10-12 DIAGNOSIS — I2510 Atherosclerotic heart disease of native coronary artery without angina pectoris: Secondary | ICD-10-CM | POA: Diagnosis not present

## 2020-10-12 DIAGNOSIS — E785 Hyperlipidemia, unspecified: Secondary | ICD-10-CM | POA: Diagnosis not present

## 2020-10-12 DIAGNOSIS — E118 Type 2 diabetes mellitus with unspecified complications: Secondary | ICD-10-CM

## 2020-10-12 DIAGNOSIS — I499 Cardiac arrhythmia, unspecified: Secondary | ICD-10-CM | POA: Insufficient documentation

## 2020-10-12 DIAGNOSIS — Z6841 Body Mass Index (BMI) 40.0 and over, adult: Secondary | ICD-10-CM | POA: Diagnosis not present

## 2020-10-12 DIAGNOSIS — R002 Palpitations: Secondary | ICD-10-CM

## 2020-10-12 DIAGNOSIS — I10 Essential (primary) hypertension: Secondary | ICD-10-CM | POA: Diagnosis not present

## 2020-10-12 MED ORDER — CARVEDILOL 6.25 MG PO TABS: 6.2500 mg | ORAL_TABLET | Freq: Two times a day (BID) | ORAL | 3 refills | Status: DC
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
## 2020-10-12 NOTE — Patient Instructions (Signed)
Medication Instructions:  Stop Metoprolol Start Coreg ( Carvedilol ) 6.25 mg twice a day Continue all other medications *If you need a refill on your cardiac medications before your next appointment, please call your pharmacy*   Lab Work: None ordered   Testing/Procedures: 30 day Event Monitor   Follow-Up: At [facility], you and your health needs are our priority.  As part of our continuing mission to provide you with exceptional heart care, we have created designated Provider Care Teams.  These Care Teams include your primary Cardiologist (physician) and Advanced Practice Providers (APPs -  Physician Assistants and Nurse Practitioners) who all work together to provide you with the care you need, when you need it.  We recommend signing up for the patient portal called "MyChart".  Sign up information is provided on this After Visit Summary.  MyChart is used to connect with patients for Virtual Visits (Telemedicine).  Patients are able to view lab/test results, encounter notes, upcoming appointments, etc.  Non-urgent messages can be sent to your provider as well.   To learn more about what you can do with MyChart, go to [email or web address].    Your next appointment:  2 months   The format for your next appointment:  Virtual     Provider: Dr.Harding     Keep a blood pressure diary    Check blood pressure 3 to 4 times a week

## 2020-10-12 NOTE — Telephone Encounter (Signed)
Enrolled patient for a 30 day [employer] Event Monitor to be mailed to patients home  

## 2020-10-13 ENCOUNTER — Other Ambulatory Visit: Payer: Self-pay | Admitting: Cardiology

## 2020-10-14 ENCOUNTER — Encounter: Payer: Self-pay | Admitting: Cardiology

## 2020-10-14 NOTE — Assessment & Plan Note (Signed)
LDL went up to 82 from 71 last visit.  I think this probably is due to his weight gain and poor dietary habits.  He is on atorvastatin 40 mg plus fenofibrate.  If LDL continues to drift up, will switch him to rosuvastatin 40 mg from atorvastatin and consider Zetia.

## 2020-10-14 NOTE — Assessment & Plan Note (Signed)
Still gaining weight.  He needs to exercise and adjust his diet.  Strongly consider dietary consult. The problem is that this would require him to drive which puts him out of his comfort zone.

## 2020-10-14 NOTE — Assessment & Plan Note (Signed)
He is having irregular heartbeat episodes of associated chest discomfort and dyspnea.  I suspect versus ENT/SVT or even just PVCs.  However need to exclude the possibility of spells of A. Fib.  Plan: 30-day event monitor

## 2020-10-14 NOTE — Assessment & Plan Note (Signed)
Moderate, nonobstructive disease in the diagonal branch but no significant obstructive disease.  He is having chest discomfort but it seems more associated with palpitations truly exertional chest pain. Plan continue treatment of metabolic syndrome.  He is on ACE inhibitor plus HCTZ along with beta-blocker--blood pressure is not controlled.   Is on fenofibrate plus atorvastatin along with aspirin. Diabetes being managed by PCP.  Plan: Convert Lopressor to carvedilol Otherwise continue current  Low threshold to consider stress testing to reassess obstructive disease.

## 2020-10-14 NOTE — Assessment & Plan Note (Signed)
Is on insulin for diabetes along with pioglitazone plus Metformin. A1c 6.7-not quite at goal.  This has improved.  With him having swelling and concern for cardiac disease, will strongly consider using a different agent besides pioglitazone.  Consider SGLT2 inhibitor however they GLP-1 agonist with the associated weight loss may be a better option.  Patient was be concerning for fluid retention

## 2020-10-14 NOTE — Assessment & Plan Note (Addendum)
Blood pressure remains high today.  At this point he has had several readings that are elevated.  Plan:  convert from Lopressor 25 mg twice daily to 6.25 mg twice daily carvedilol   continue ramipril  &HCTZ.  Keep BP log

## 2020-10-17 ENCOUNTER — Ambulatory Visit (INDEPENDENT_AMBULATORY_CARE_PROVIDER_SITE_OTHER): Payer: Medicare PPO

## 2020-10-17 DIAGNOSIS — R002 Palpitations: Secondary | ICD-10-CM | POA: Diagnosis not present

## 2020-10-17 DIAGNOSIS — I2510 Atherosclerotic heart disease of native coronary artery without angina pectoris: Secondary | ICD-10-CM

## 2020-10-17 DIAGNOSIS — I499 Cardiac arrhythmia, unspecified: Secondary | ICD-10-CM

## 2020-10-17 DIAGNOSIS — I10 Essential (primary) hypertension: Secondary | ICD-10-CM | POA: Diagnosis not present

## 2020-10-27 ENCOUNTER — Other Ambulatory Visit: Payer: Self-pay | Admitting: Cardiology

## 2020-11-10 DIAGNOSIS — G4733 Obstructive sleep apnea (adult) (pediatric): Secondary | ICD-10-CM | POA: Diagnosis not present

## 2020-11-23 ENCOUNTER — Other Ambulatory Visit: Payer: Self-pay | Admitting: Cardiology

## 2020-11-23 DIAGNOSIS — E113393 Type 2 diabetes mellitus with moderate nonproliferative diabetic retinopathy without macular edema, bilateral: Secondary | ICD-10-CM | POA: Diagnosis not present

## 2020-12-15 ENCOUNTER — Telehealth (INDEPENDENT_AMBULATORY_CARE_PROVIDER_SITE_OTHER): Payer: Medicare PPO | Admitting: Cardiology

## 2020-12-15 ENCOUNTER — Encounter: Payer: Self-pay | Admitting: Cardiology

## 2020-12-15 VITALS — BP 148/82 | HR 79 | Ht 68.0 in

## 2020-12-15 DIAGNOSIS — I10 Essential (primary) hypertension: Secondary | ICD-10-CM

## 2020-12-15 DIAGNOSIS — E1169 Type 2 diabetes mellitus with other specified complication: Secondary | ICD-10-CM | POA: Diagnosis not present

## 2020-12-15 DIAGNOSIS — I499 Cardiac arrhythmia, unspecified: Secondary | ICD-10-CM | POA: Diagnosis not present

## 2020-12-15 DIAGNOSIS — E785 Hyperlipidemia, unspecified: Secondary | ICD-10-CM | POA: Diagnosis not present

## 2020-12-15 DIAGNOSIS — I2510 Atherosclerotic heart disease of native coronary artery without angina pectoris: Secondary | ICD-10-CM | POA: Diagnosis not present

## 2020-12-15 DIAGNOSIS — Z6841 Body Mass Index (BMI) 40.0 and over, adult: Secondary | ICD-10-CM | POA: Diagnosis not present

## 2020-12-15 MED ORDER — CARVEDILOL 12.5 MG PO TABS: 12.5000 mg | ORAL_TABLET | Freq: Two times a day (BID) | ORAL | 2 refills | Status: DC
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
## 2020-12-15 NOTE — Patient Instructions (Signed)
Medication Instructions:    Increase carvedilol dose to 2 of existing tablets (total 12.5 mg) 2 times a day until current pills complete  -New Rx: Carvedilol 12.5 mg by mouth twice daily, dispense #180 tab, 3 refills  *If you need a refill on your cardiac medications before your next appointment, please call your pharmacy*   Lab Work: None If you have labs (blood work) drawn today and your tests are completely normal, you will receive your results only by: Marland Kitchen MyChart Message (if you have MyChart) OR . A paper copy in the mail If you have any lab test that is abnormal or we need to change your treatment, we will call you to review the results.   Testing/Procedures: None   Follow-Up: At [facility], you and your health needs are our priority.  As part of our continuing mission to provide you with exceptional heart care, we have created designated Provider Care Teams.  These Care Teams include your primary Cardiologist (physician) and Advanced Practice Providers (APPs -  Physician Assistants and Nurse Practitioners) who all work together to provide you with the care you need, when you need it.  We recommend signing up for the patient portal called "MyChart".  Sign up information is provided on this After Visit Summary.  MyChart is used to connect with patients for Virtual Visits (Telemedicine).  Patients are able to view lab/test results, encounter notes, upcoming appointments, etc.  Non-urgent messages can be sent to your provider as well.   To learn more about what you can do with MyChart, go to [email or web address].    Your next appointment:   8 month(s)  The format for your next appointment:   In Person  Provider:   [name], MD   Other Instructions I think the idea of joining the gym and getting exercise trying to lose weight will help your blood pressure, your cholesterol and your overall health.

## 2021-01-01 ENCOUNTER — Encounter: Payer: Self-pay | Admitting: Cardiology

## 2021-01-01 NOTE — Assessment & Plan Note (Signed)
Nothing really significant noted on monitor.  No bursts of SVT or PAT noted.  Minimal PACs and PVCs.  I will increase his carvedilol dose to 12.5 mg twice daily for additional blood pressure control.  Hopefully this will help reduce of PACs/PVCs.

## 2021-01-01 NOTE — Assessment & Plan Note (Signed)
Blood pressure is elevated today.  With him having some palpitation symptoms, best course of action would be to increase carvedilol to 12.5 mg twice daily.  Otherwise continue current meds: Metoprolol and HCTZ.Marland Kitchen

## 2021-01-01 NOTE — Assessment & Plan Note (Signed)
Moderate, nonobstructive CAD noted on cath. Some mild chest twinges, not likely to be anginal in nature.  Plan: Continue beta-blocker but with increased dose along with ACE inhibitor/HCTZ and statin. Continue prophylactic aspirin.

## 2021-01-01 NOTE — Assessment & Plan Note (Addendum)
He is due for labs rechecked in March.  Would like to see what his LDL does.  He went up from 71-82.  Target LDL should be closer to 70 based on his existing CAD. He is currently on atorvastatin 40 mg, if LDL continues to drift up, would probably consider either switching to rosuvastatin 4 mg or adding Zetia.  He is on Levemir insulin along with pioglitazone.  My recommendation will be to convert from betamethasone to a GLP-1 agonist which should help with weight loss as well.

## 2021-01-01 NOTE — Assessment & Plan Note (Signed)
With hypertension, hyperlipidemia and diabetes with obesity, he needs metabolic syndrome.  Needs to lose weight.  Talk about importance of dietary modification and increasing exercise.  Consider GLP-1 agonist to treat diabetes with potential for weight loss as well.

## 2021-01-16 DIAGNOSIS — Z125 Encounter for screening for malignant neoplasm of prostate: Secondary | ICD-10-CM | POA: Diagnosis not present

## 2021-01-16 DIAGNOSIS — E782 Mixed hyperlipidemia: Secondary | ICD-10-CM | POA: Diagnosis not present

## 2021-01-16 DIAGNOSIS — E1140 Type 2 diabetes mellitus with diabetic neuropathy, unspecified: Secondary | ICD-10-CM | POA: Diagnosis not present

## 2021-01-16 DIAGNOSIS — I10 Essential (primary) hypertension: Secondary | ICD-10-CM | POA: Diagnosis not present

## 2021-01-18 DIAGNOSIS — I10 Essential (primary) hypertension: Secondary | ICD-10-CM | POA: Diagnosis not present

## 2021-01-18 DIAGNOSIS — E1140 Type 2 diabetes mellitus with diabetic neuropathy, unspecified: Secondary | ICD-10-CM | POA: Diagnosis not present

## 2021-01-18 DIAGNOSIS — R5383 Other fatigue: Secondary | ICD-10-CM | POA: Diagnosis not present

## 2021-01-18 DIAGNOSIS — G4733 Obstructive sleep apnea (adult) (pediatric): Secondary | ICD-10-CM | POA: Diagnosis not present

## 2021-01-18 DIAGNOSIS — E782 Mixed hyperlipidemia: Secondary | ICD-10-CM | POA: Diagnosis not present

## 2021-01-18 DIAGNOSIS — I2510 Atherosclerotic heart disease of native coronary artery without angina pectoris: Secondary | ICD-10-CM | POA: Diagnosis not present

## 2021-01-18 DIAGNOSIS — K219 Gastro-esophageal reflux disease without esophagitis: Secondary | ICD-10-CM | POA: Diagnosis not present

## 2021-01-18 DIAGNOSIS — Z6841 Body Mass Index (BMI) 40.0 and over, adult: Secondary | ICD-10-CM | POA: Diagnosis not present

## 2021-02-08 DIAGNOSIS — G4733 Obstructive sleep apnea (adult) (pediatric): Secondary | ICD-10-CM | POA: Diagnosis not present

## 2021-02-22 DIAGNOSIS — Z6841 Body Mass Index (BMI) 40.0 and over, adult: Secondary | ICD-10-CM | POA: Diagnosis not present

## 2021-02-22 DIAGNOSIS — M25561 Pain in right knee: Secondary | ICD-10-CM | POA: Diagnosis not present

## 2021-02-26 DIAGNOSIS — N401 Enlarged prostate with lower urinary tract symptoms: Secondary | ICD-10-CM | POA: Diagnosis not present

## 2021-02-26 DIAGNOSIS — N200 Calculus of kidney: Secondary | ICD-10-CM | POA: Diagnosis not present

## 2021-03-12 DIAGNOSIS — Z9181 History of falling: Secondary | ICD-10-CM | POA: Diagnosis not present

## 2021-03-12 DIAGNOSIS — Z1331 Encounter for screening for depression: Secondary | ICD-10-CM | POA: Diagnosis not present

## 2021-03-12 DIAGNOSIS — E669 Obesity, unspecified: Secondary | ICD-10-CM | POA: Diagnosis not present

## 2021-03-12 DIAGNOSIS — Z0000 Encounter for general adult medical examination without abnormal findings: Secondary | ICD-10-CM | POA: Diagnosis not present

## 2021-03-12 DIAGNOSIS — E785 Hyperlipidemia, unspecified: Secondary | ICD-10-CM | POA: Diagnosis not present

## 2021-03-23 ENCOUNTER — Other Ambulatory Visit: Payer: Self-pay | Admitting: Cardiology

## 2021-05-16 DIAGNOSIS — E113393 Type 2 diabetes mellitus with moderate nonproliferative diabetic retinopathy without macular edema, bilateral: Secondary | ICD-10-CM | POA: Diagnosis not present

## 2021-06-20 DIAGNOSIS — G4733 Obstructive sleep apnea (adult) (pediatric): Secondary | ICD-10-CM | POA: Diagnosis not present

## 2021-07-23 DIAGNOSIS — E1140 Type 2 diabetes mellitus with diabetic neuropathy, unspecified: Secondary | ICD-10-CM | POA: Diagnosis not present

## 2021-07-23 DIAGNOSIS — Z79899 Other long term (current) drug therapy: Secondary | ICD-10-CM | POA: Diagnosis not present

## 2021-07-23 DIAGNOSIS — I10 Essential (primary) hypertension: Secondary | ICD-10-CM | POA: Diagnosis not present

## 2021-07-23 DIAGNOSIS — E782 Mixed hyperlipidemia: Secondary | ICD-10-CM | POA: Diagnosis not present

## 2021-07-23 DIAGNOSIS — E538 Deficiency of other specified B group vitamins: Secondary | ICD-10-CM | POA: Diagnosis not present

## 2021-07-26 DIAGNOSIS — K219 Gastro-esophageal reflux disease without esophagitis: Secondary | ICD-10-CM | POA: Diagnosis not present

## 2021-07-26 DIAGNOSIS — I2510 Atherosclerotic heart disease of native coronary artery without angina pectoris: Secondary | ICD-10-CM | POA: Diagnosis not present

## 2021-07-26 DIAGNOSIS — E1140 Type 2 diabetes mellitus with diabetic neuropathy, unspecified: Secondary | ICD-10-CM | POA: Diagnosis not present

## 2021-07-26 DIAGNOSIS — Z139 Encounter for screening, unspecified: Secondary | ICD-10-CM | POA: Diagnosis not present

## 2021-07-26 DIAGNOSIS — I10 Essential (primary) hypertension: Secondary | ICD-10-CM | POA: Diagnosis not present

## 2021-07-26 DIAGNOSIS — Z6841 Body Mass Index (BMI) 40.0 and over, adult: Secondary | ICD-10-CM | POA: Diagnosis not present

## 2021-07-26 DIAGNOSIS — E782 Mixed hyperlipidemia: Secondary | ICD-10-CM | POA: Diagnosis not present

## 2021-07-26 DIAGNOSIS — G4733 Obstructive sleep apnea (adult) (pediatric): Secondary | ICD-10-CM | POA: Diagnosis not present

## 2021-09-03 DIAGNOSIS — N200 Calculus of kidney: Secondary | ICD-10-CM | POA: Diagnosis not present

## 2021-09-03 DIAGNOSIS — R351 Nocturia: Secondary | ICD-10-CM | POA: Diagnosis not present

## 2021-09-03 DIAGNOSIS — N401 Enlarged prostate with lower urinary tract symptoms: Secondary | ICD-10-CM | POA: Diagnosis not present

## 2021-09-16 ENCOUNTER — Other Ambulatory Visit: Payer: Self-pay | Admitting: Cardiology

## 2021-10-24 ENCOUNTER — Other Ambulatory Visit: Payer: Self-pay | Admitting: Cardiology

## 2021-10-30 DIAGNOSIS — N452 Orchitis: Secondary | ICD-10-CM | POA: Diagnosis not present

## 2021-10-30 DIAGNOSIS — Z6841 Body Mass Index (BMI) 40.0 and over, adult: Secondary | ICD-10-CM | POA: Diagnosis not present

## 2021-11-06 DIAGNOSIS — Z6841 Body Mass Index (BMI) 40.0 and over, adult: Secondary | ICD-10-CM | POA: Diagnosis not present

## 2021-11-06 DIAGNOSIS — N452 Orchitis: Secondary | ICD-10-CM | POA: Diagnosis not present

## 2021-11-26 DIAGNOSIS — E113393 Type 2 diabetes mellitus with moderate nonproliferative diabetic retinopathy without macular edema, bilateral: Secondary | ICD-10-CM | POA: Diagnosis not present

## 2021-11-30 DIAGNOSIS — R0789 Other chest pain: Secondary | ICD-10-CM | POA: Diagnosis not present

## 2021-11-30 DIAGNOSIS — I10 Essential (primary) hypertension: Secondary | ICD-10-CM | POA: Diagnosis not present

## 2021-11-30 DIAGNOSIS — R079 Chest pain, unspecified: Secondary | ICD-10-CM | POA: Diagnosis not present

## 2021-12-01 ENCOUNTER — Emergency Department: Payer: Medicare PPO

## 2021-12-01 ENCOUNTER — Emergency Department
Admission: EM | Admit: 2021-12-01 | Discharge: 2021-12-01 | Disposition: A | Discharge status: Home / Self Care | Payer: Medicare PPO | Attending: Emergency Medicine | Admitting: Emergency Medicine

## 2021-12-01 DIAGNOSIS — E119 Type 2 diabetes mellitus without complications: Secondary | ICD-10-CM | POA: Diagnosis not present

## 2021-12-01 DIAGNOSIS — R079 Chest pain, unspecified: Secondary | ICD-10-CM | POA: Insufficient documentation

## 2021-12-01 DIAGNOSIS — R110 Nausea: Secondary | ICD-10-CM | POA: Insufficient documentation

## 2021-12-01 DIAGNOSIS — Z96612 Presence of left artificial shoulder joint: Secondary | ICD-10-CM | POA: Insufficient documentation

## 2021-12-01 DIAGNOSIS — I10 Essential (primary) hypertension: Secondary | ICD-10-CM | POA: Diagnosis not present

## 2021-12-01 DIAGNOSIS — R0789 Other chest pain: Secondary | ICD-10-CM | POA: Diagnosis not present

## 2021-12-01 DIAGNOSIS — R0602 Shortness of breath: Secondary | ICD-10-CM | POA: Insufficient documentation

## 2021-12-01 LAB — COMPREHENSIVE METABOLIC PANEL
ALT: 25 U/L (ref 0–44)
AST: 22 U/L (ref 15–41)
Albumin: 4.4 g/dL (ref 3.5–5.0)
Alkaline Phosphatase: 84 U/L (ref 38–126)
Anion gap: 10 (ref 5–15)
BUN: 22 mg/dL (ref 8–23)
CO2: 27 mmol/L (ref 22–32)
Calcium: 9.3 mg/dL (ref 8.9–10.3)
Chloride: 101 mmol/L (ref 98–111)
Creatinine, Ser: 1.22 mg/dL (ref 0.61–1.24)
GFR, Estimated: >60 mL/min (ref >60)
Glucose, Bld: 96 mg/dL (ref 70–99)
Potassium: 3.6 mmol/L (ref 3.5–5.1)
Sodium: 138 mmol/L (ref 135–145)
Total Bilirubin: 0.7 mg/dL (ref 0.3–1.2)
Total Protein: 7.1 g/dL (ref 6.5–8.1)

## 2021-12-01 LAB — CBC WITH DIFFERENTIAL/PLATELET
Abs Immature Granulocytes: 0.06 10*3/uL (ref 0.00–0.07)
Basophils Absolute: 0 10*3/uL (ref 0.0–0.1)
Basophils Relative: 1 %
Eosinophils Absolute: 0.1 10*3/uL (ref 0.0–0.5)
Eosinophils Relative: 1 %
HCT: 44.5 % (ref 39.0–52.0)
Hemoglobin: 15 g/dL (ref 13.0–17.0)
Immature Granulocytes: 1 %
Lymphocytes Relative: 25 %
Lymphs Abs: 1.8 10*3/uL (ref 0.7–4.0)
MCH: 31.6 pg (ref 26.0–34.0)
MCHC: 33.7 g/dL (ref 30.0–36.0)
MCV: 93.7 fL (ref 80.0–100.0)
Monocytes Absolute: 0.7 10*3/uL (ref 0.1–1.0)
Monocytes Relative: 9 %
Neutro Abs: 4.5 10*3/uL (ref 1.7–7.7)
Neutrophils Relative %: 63 %
Platelets: 211 10*3/uL (ref 150–400)
RBC: 4.75 MIL/uL (ref 4.22–5.81)
RDW: 13.4 % (ref 11.5–15.5)
WBC: 7.2 10*3/uL (ref 4.0–10.5)
nRBC: 0 % (ref 0.0–0.2)

## 2021-12-01 LAB — TROPONIN I (HIGH SENSITIVITY)
Troponin I (High Sensitivity): 5 ng/L (ref <18)
Troponin I (High Sensitivity): 5 ng/L (ref <18)

## 2021-12-01 NOTE — Discharge Instructions (Addendum)
As we discussed, it is very important for you to have a close follow-up with your cardiologist since your left heart catheterization was so many years ago and you keep having worsening chest pain.  In the meantime return to the emergency room if you have new or worsening chest pain.  Continue to take your daily dose of aspirin.

## 2021-12-01 NOTE — ED Notes (Signed)
Spouse updated on wait.
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
## 2021-12-01 NOTE — ED Triage Notes (Signed)
Pt presents via EMS with complaints for CP that started tonight. PTA the patient received 6 -81mg  ASA and declined Nitro - he noted improvement following the ASA administration. He states that pain is localized to the left side of his chest and feels "like pressure". Hx of cardiac cath in 2018.

## 2021-12-03 ENCOUNTER — Telehealth: Payer: Self-pay | Admitting: Cardiology

## 2021-12-03 NOTE — Telephone Encounter (Signed)
Noted  Received note from ER physician.  [name], MD

## 2021-12-03 NOTE — Telephone Encounter (Signed)
Pt c/o of Chest Pain: STAT if CP now or developed within 24 hours  1. Are you having CP right now? A little at this time-its more of a discomfort in his chest feels like a squeezing   2. Are you experiencing any other symptoms (ex. SOB, nausea, vomiting, sweating)? no  3. How long have you been experiencing CP? Been about 2 months, but getting worse- went to ER at [facility] on Friday night(11-30-21)   4. Is your CP continuous or coming and going?staying most of the time  5. Have you taken Nitroglycerin? Do not have any- I made patient appointment for Thursday with Dr Ellyn Hack in [place] on Thursday(2-9-2) please call to evaluate ?

## 2021-12-03 NOTE — Telephone Encounter (Signed)
Spoke with patient who reports chest tightness for the past 2 days on and off. He went to the ED this past Friday. He stated he refused a catheterization and refused ntg. He wanted to wait until he see Dr. Ellyn Hack on 12/06/21. He stated he takes an extra 81 mg aspirin when he feel the chest tightness. I recommended he take  a 325 mg aspiring and chew it when he feels chest tightness, chest pressure, or chest pain. He stated that's what the paramedics gave him. His blood pressure cuff (arm and wrist) are not working, so I could not get a BP. He stated if he doesn't get any relief from aspirin, he will go back to the ED. I agreed with him.

## 2021-12-06 ENCOUNTER — Other Ambulatory Visit: Payer: Self-pay

## 2021-12-06 ENCOUNTER — Ambulatory Visit: Payer: Medicare PPO | Admitting: Cardiology

## 2021-12-06 ENCOUNTER — Encounter: Payer: Self-pay | Admitting: Cardiology

## 2021-12-06 ENCOUNTER — Telehealth: Payer: Self-pay | Admitting: Cardiology

## 2021-12-06 VITALS — BP 130/76 | HR 66 | Ht 68.0 in | Wt 274.0 lb

## 2021-12-06 DIAGNOSIS — I499 Cardiac arrhythmia, unspecified: Secondary | ICD-10-CM

## 2021-12-06 DIAGNOSIS — I25118 Atherosclerotic heart disease of native coronary artery with other forms of angina pectoris: Secondary | ICD-10-CM | POA: Diagnosis not present

## 2021-12-06 DIAGNOSIS — E785 Hyperlipidemia, unspecified: Secondary | ICD-10-CM | POA: Diagnosis not present

## 2021-12-06 DIAGNOSIS — I10 Essential (primary) hypertension: Secondary | ICD-10-CM

## 2021-12-06 DIAGNOSIS — E1169 Type 2 diabetes mellitus with other specified complication: Secondary | ICD-10-CM | POA: Diagnosis not present

## 2021-12-06 DIAGNOSIS — I2510 Atherosclerotic heart disease of native coronary artery without angina pectoris: Secondary | ICD-10-CM | POA: Diagnosis not present

## 2021-12-06 DIAGNOSIS — Z6841 Body Mass Index (BMI) 40.0 and over, adult: Secondary | ICD-10-CM | POA: Diagnosis not present

## 2021-12-06 DIAGNOSIS — R072 Precordial pain: Secondary | ICD-10-CM | POA: Insufficient documentation

## 2021-12-06 MED ORDER — ISOSORBIDE MONONITRATE ER 30 MG PO TB24: 30.0000 mg | ORAL_TABLET | Freq: Every day | ORAL | 3 refills | Status: DC

## 2021-12-06 NOTE — Assessment & Plan Note (Signed)
Precordial pain in the patient with pretty coronary disease risk factors, but not enough to say that it warrants going directly to cardiac catheterization.  I am worried about nuclear stress test given his body habitus and the fact that the symptoms are at rest.  For ischemic evaluation we will opt for coronary CT angiogram with possible FFRCT.  Further plans based on results.  Continue beta-blocker and ACE inhibitor.  Continue to stay active.

## 2021-12-06 NOTE — Assessment & Plan Note (Signed)
CT well-controlled currently on beta-blocker

## 2021-12-06 NOTE — Telephone Encounter (Signed)
Please call to discuss Corlanor. Patient is not sure when he should take this. Also, has a question regarding Hydrochlorothiazide and Ramipril and if he should hold these the day of his CT.

## 2021-12-06 NOTE — Assessment & Plan Note (Signed)
Blood pressure seems to be pretty well controlled today.  Borderline but within normal range.  He never did increase his carvedilol dose last visit.  Remains on 6.25 mg twice daily along with 25 mg daily HCTZ and 10 mg ramipril.  Reassess in follow-up

## 2021-12-06 NOTE — Assessment & Plan Note (Signed)
Clearly meets metabolic syndrome criteria with lipids, hypertension, obesity and diabetes this puts him at increased risk.  We have evaluated him for ischemia in the past with a nonischemic Catheterization.  At this point chest pain is not convincing enough to consider another ischemic evaluation, but given his body habitus, I do think that Coronary CTA will be more beneficial study.

## 2021-12-06 NOTE — Patient Instructions (Signed)
Medication Instructions:  Your physician has recommended you make the following change in your medication:   CONTINUE taking aspirin 81 mg, CHANGE to taking at night   START taking isosorbide mononitrate (Imdur) 30 mg, take 30 minutes after you take your aspirin    *If you need a refill on your cardiac medications before your next appointment, please call your pharmacy*   Lab Work:  Your provider has ordered labs (BMET) to be drawn today before you leave.   If you have labs (blood work) drawn today and your tests are completely normal, you will receive your results only by: [place] (if you have MyChart) OR A paper copy in the mail If you have any lab test that is abnormal or we need to change your treatment, we will call you to review the results.   Testing/Procedures:   Your cardiac CT is scheduled for February 19th, 2023 at 12:30 pm at:  [facility] [place], [place] [address] [phone]  Please arrive 15 mins early for check-in and test prep.    Please follow these instructions carefully (unless otherwise directed):   On the Night Before the Test: Be sure to Drink plenty of water. Do not consume any caffeinated/decaffeinated beverages or chocolate 12 hours prior to your test.  On the Day of the Test: Drink plenty of water until 1 hour prior to the test. Do not eat any food 4 hours prior to the test. You may take your regular medications prior to the test.  HOLD Hydrochlorothiazide morning of the test.        After the Test: Drink plenty of water. After receiving IV contrast, you may experience a mild flushed feeling. This is normal. On occasion, you may experience a mild rash up to 24 hours after the test. This is not dangerous. If this occurs, you can take Benadryl 25 mg and increase your fluid intake. If you experience trouble breathing, this can be serious. If it is severe call 911  IMMEDIATELY. If it is mild, please call our office. If you take any of these medications: Glipizide/Metformin, Avandament, Glucavance, please do not take 48 hours after completing test unless otherwise instructed.  Please allow 2-4 weeks for scheduling of routine cardiac CTs. Some insurance companies require a pre-authorization which may delay scheduling of this test.   For non-scheduling related questions, please contact the cardiac imaging nurse navigator should you have any questions/concerns: Marchia Bond, Cardiac Imaging Nurse Navigator Gordy Clement, Cardiac Imaging Nurse Navigator [facility] Direct Office Dial: [phone]   For scheduling needs, including cancellations and rescheduling, please call [place], [phone].     Follow-Up: At [facility], you and your health needs are our priority.  As part of our continuing mission to provide you with exceptional heart care, we have created designated Provider Care Teams.  These Care Teams include your primary Cardiologist (physician) and Advanced Practice Providers (APPs -  Physician Assistants and Nurse Practitioners) who all work together to provide you with the care you need, when you need it.  We recommend signing up for the patient portal called "MyChart".  Sign up information is provided on this After Visit Summary.  MyChart is used to connect with patients for Virtual Visits (Telemedicine).  Patients are able to view lab/test results, encounter notes, upcoming appointments, etc.  Non-urgent messages can be sent to your provider as well.   To learn more about what you can do  with MyChart, go to [email or web address].    Your next appointment:   3 week(s)  The format for your next appointment:   In Person  Provider:   You may see [name], MD or one of the following Advanced Practice Providers on your designated Care Team:   Murray Hodgkins, NP Christell Faith, PA-C Cadence Kathlen Mody, PA-C1}     Other Instructions N/A
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
## 2021-12-06 NOTE — Assessment & Plan Note (Signed)
I have not seen labs on him.  Unfortunately do not get labs from PCP here.  Would hope that his LDL goal is being met.  Had been in the 70-80 range when I last saw him on current dose of atorvastatin.  We will be able to adjust LDL goals based on Coronary CTA results.  For diabetes he is on [place] now having switched from Levemir insulin.  He says his sugars are doing better.  He still on Actoplus Met-not really having any heart failure symptoms with pioglitazone, but need to monitor.  Would consider GLP-1 agonist such as Ozempic for weight loss purposes as well as glycemic control.

## 2021-12-06 NOTE — Telephone Encounter (Signed)
Called patient's wife back, OK per DPR.   Patient's wife wanted to know when he was supposed to take extra of his carvedilol to slow his heart rate down. Stated that she couldn't remember if Dr. Ellyn Hack had told them to do this the day of his CT or the day they came back to see him. Told Hassan Rowan that pt should take extra of his carvedilol as instructed by Dr. Ellyn Hack 2 hours prior to his CT to help get better pictures.   Also wanted to know if ramipril needed to the held in addition to HCTZ for his CT. I explained that wasn't something we typically had patients hold. Hassan Rowan stated that she was going to have him hold it before the test and would give it to him after, as she was more comfortable with that.   Patient voiced appreciation for the call and understands that they can reach out to our office with any questions or concerns.

## 2021-12-06 NOTE — Assessment & Plan Note (Signed)
He had moderate nonobstructive disease by cath many years ago.  Now complaining of chest pain in the mostly at rest.  Would be unusual to have a fixed coronary stenosis causing resting pain and not exertional pain.  A nuclear stress test would not be effective to evaluate resting pain. Better test is Coronary CTA with FFRCT.  For now he is already on aspirin statin beta-blocker and ACE inhibitor.  On stable doses.  Plan: Coronary CT angiogram with possible FFRCT.

## 2021-12-07 LAB — BASIC METABOLIC PANEL
BUN/Creatinine Ratio: 14 (ref 10–24)
BUN: 19 mg/dL (ref 8–27)
CO2: 23 mmol/L (ref 20–29)
Calcium: 9.5 mg/dL (ref 8.6–10.2)
Chloride: 98 mmol/L (ref 96–106)
Creatinine, Ser: 1.32 mg/dL — ABNORMAL HIGH (ref 0.76–1.27)
Glucose: 115 mg/dL — ABNORMAL HIGH (ref 70–99)
Potassium: 4.7 mmol/L (ref 3.5–5.2)
Sodium: 139 mmol/L (ref 134–144)
eGFR: 59 mL/min/{1.73_m2} — ABNORMAL LOW (ref >59)

## 2021-12-11 ENCOUNTER — Telehealth (HOSPITAL_COMMUNITY): Payer: Self-pay | Admitting: *Deleted

## 2021-12-11 NOTE — Telephone Encounter (Signed)
Reaching out to patient to offer assistance regarding upcoming cardiac imaging study; pt's wife verbalizes understanding of appt date/time, parking situation and where to check in, pre-test NPO status and medications ordered, and verified current allergies; name and call back number provided for further questions should they arise  Gordy Clement RN Navigator Cardiac Imaging [facility] [phone] office [phone] cell  Patient to take 12.5mg  Carvedilol two hours prior to his cardiac CT scan.

## 2021-12-13 ENCOUNTER — Other Ambulatory Visit: Payer: Self-pay | Admitting: Cardiology

## 2021-12-13 ENCOUNTER — Other Ambulatory Visit: Payer: Self-pay

## 2021-12-13 ENCOUNTER — Ambulatory Visit
Admission: RE | Admit: 2021-12-13 | Discharge: 2021-12-13 | Disposition: A | Discharge status: Home / Self Care | Payer: Medicare PPO | Source: Ambulatory Visit | Attending: Cardiology | Admitting: Cardiology

## 2021-12-13 DIAGNOSIS — R931 Abnormal findings on diagnostic imaging of heart and coronary circulation: Secondary | ICD-10-CM | POA: Diagnosis not present

## 2021-12-13 DIAGNOSIS — I25118 Atherosclerotic heart disease of native coronary artery with other forms of angina pectoris: Secondary | ICD-10-CM | POA: Diagnosis not present

## 2021-12-13 DIAGNOSIS — I2510 Atherosclerotic heart disease of native coronary artery without angina pectoris: Secondary | ICD-10-CM | POA: Diagnosis not present

## 2021-12-13 MED ORDER — NITROGLYCERIN 0.4 MG SL SUBL
0.8000 mg | SUBLINGUAL_TABLET | Freq: Once | SUBLINGUAL | Status: AC
  Administered 2021-12-13: 0.8 mg via SUBLINGUAL

## 2021-12-13 MED ORDER — IOHEXOL 350 MG/ML SOLN
125.0000 mL | Freq: Once | INTRAVENOUS | Status: AC | PRN
  Administered 2021-12-13: 100 mL via INTRAVENOUS

## 2021-12-13 NOTE — Progress Notes (Signed)
Patient tolerated CT well. Drank water after. Vital signs stable encourage to drink water throughout day.Reasons explained and verbalized understanding. Ambulated steady gait.  

## 2021-12-14 ENCOUNTER — Telehealth: Payer: Self-pay | Admitting: Emergency Medicine

## 2021-12-14 ENCOUNTER — Other Ambulatory Visit: Payer: Self-pay | Admitting: Cardiology

## 2021-12-14 DIAGNOSIS — R931 Abnormal findings on diagnostic imaging of heart and coronary circulation: Secondary | ICD-10-CM | POA: Diagnosis not present

## 2021-12-14 DIAGNOSIS — I2510 Atherosclerotic heart disease of native coronary artery without angina pectoris: Secondary | ICD-10-CM | POA: Diagnosis not present

## 2021-12-14 HISTORY — PX: OTHER SURGICAL HISTORY: SHX169

## 2021-12-14 NOTE — Telephone Encounter (Signed)
-----   Message from Leonie Man, MD sent at 12/13/2021 11:24 PM EST ----- Coronary CTA results:  Calcium score 460. For the most part mild disease with a 1 focal area of "moderate " narrowing => ~50%.  This will be evaluated with offline "stress test" program with result to follow.   Should have results in 1-2 days.  [name], MD

## 2021-12-14 NOTE — Telephone Encounter (Signed)
Darren Man, MD  P [facility] Triage; Mila Merry, RN CT FFR results (stress test complement) of the Coronary CTA) look good.   No evidence of significant focal narrowing.  There does appear to be the narrowing that we saw on the CT scan, however they do not appear to be "flow-limiting "which would mean they are not likely to cause symptoms.  As such we do not need to consider cardiac catheterization.  We simply need to treat risk factors.   This would tell us that the chest discomfort is probably not related to big artery disease where we need to go to the Cath Lab.   [name], MD    Called and spoke with patient. Went over results of CTA and CT FFR with patient. Pt verbalized understanding, no questions at this time.   Patient voiced appreciation for the call and understands that they can reach out to our office with any questions or concerns.

## 2021-12-26 HISTORY — PX: TRANSTHORACIC ECHOCARDIOGRAM: SHX275

## 2021-12-27 ENCOUNTER — Other Ambulatory Visit: Payer: Self-pay

## 2021-12-27 ENCOUNTER — Ambulatory Visit: Payer: Medicare PPO | Admitting: Cardiology

## 2021-12-27 ENCOUNTER — Encounter: Payer: Self-pay | Admitting: Cardiology

## 2021-12-27 VITALS — BP 138/70 | HR 64 | Ht 68.0 in | Wt 281.0 lb

## 2021-12-27 DIAGNOSIS — G4733 Obstructive sleep apnea (adult) (pediatric): Secondary | ICD-10-CM | POA: Diagnosis not present

## 2021-12-27 DIAGNOSIS — I499 Cardiac arrhythmia, unspecified: Secondary | ICD-10-CM | POA: Diagnosis not present

## 2021-12-27 DIAGNOSIS — I2510 Atherosclerotic heart disease of native coronary artery without angina pectoris: Secondary | ICD-10-CM

## 2021-12-27 DIAGNOSIS — Z6841 Body Mass Index (BMI) 40.0 and over, adult: Secondary | ICD-10-CM

## 2021-12-27 DIAGNOSIS — R600 Localized edema: Secondary | ICD-10-CM | POA: Diagnosis not present

## 2021-12-27 DIAGNOSIS — R0609 Other forms of dyspnea: Secondary | ICD-10-CM

## 2021-12-27 DIAGNOSIS — E1169 Type 2 diabetes mellitus with other specified complication: Secondary | ICD-10-CM | POA: Diagnosis not present

## 2021-12-27 DIAGNOSIS — I10 Essential (primary) hypertension: Secondary | ICD-10-CM | POA: Diagnosis not present

## 2021-12-27 DIAGNOSIS — E785 Hyperlipidemia, unspecified: Secondary | ICD-10-CM

## 2021-12-27 MED ORDER — FUROSEMIDE 20 MG PO TABS: 20.0000 mg | ORAL_TABLET | Freq: Every day | ORAL | 3 refills | Status: DC

## 2021-12-27 NOTE — Patient Instructions (Addendum)
Medication Instructions:  ?Your physician has recommended you make the following change in your medication:  ? ?STOP taking HCTZ  ? ?STOP taking Imdur  ? ?START taking furosemide (Lasix) 20 mg. Take 40 mg (2 tablets) daily for the first week, after that take 20 mg daily ? ?*If you need a refill on your cardiac medications before your next appointment, please call your pharmacy* ? ? ?Lab Work: ?None ordered ? ?If you have labs (blood work) drawn today and your tests are completely normal, you will receive your results only by: ?MyChart Message (if you have MyChart) OR ?A paper copy in the mail ?If you have any lab test that is abnormal or we need to change your treatment, we will call you to review the results. ? ? ?Testing/Procedures: ?None ordered ? ? ?Follow-Up: ?At [facility], you and your health needs are our priority.  As part of our continuing mission to provide you with exceptional heart care, we have created designated Provider Care Teams.  These Care Teams include your primary Cardiologist (physician) and Advanced Practice Providers (APPs -  Physician Assistants and Nurse Practitioners) who all work together to provide you with the care you need, when you need it. ? ?We recommend signing up for the patient portal called "MyChart".  Sign up information is provided on this After Visit Summary.  MyChart is used to connect with patients for Virtual Visits (Telemedicine).  Patients are able to view lab/test results, encounter notes, upcoming appointments, etc.  Non-urgent messages can be sent to your provider as well.   ?To learn more about what you can do with MyChart, go to [email or web address].   ? ?Your next appointment:   ?6-8 week(s) ? ?The format for your next appointment:   ?In Person ? ?Provider:   ?You may see [name], MD or one of the following Advanced Practice Providers on your designated Care Team:   ?Murray Hodgkins, NP ?Christell Faith, PA-C ?Cadence Kathlen Mody, PA-C ? ? ?Other  Instructions ?N/A ? ? ?Will be contacted to schedule Echocardiogram.  ?

## 2021-12-27 NOTE — Assessment & Plan Note (Signed)
This seems to be his main concern now.  It seems like it is probably venous stasis related, does really have any PND orthopnea per se.  Could be related to OSA. ? ?Plan: Check 2D echocardiogram and convert from HCTZ to furosemide. ? ?We will start furosemide 20 mg daily but 2 tablets daily for a week and then reduce down to 20 mg in place of HCTZ. ?

## 2021-12-27 NOTE — Assessment & Plan Note (Signed)
Nonocclusive CAD. ? ?Plan:  ?? Continue aspirin and statin. ?? Continue beta-blocker and ACE inhibitor-low threshold to increase carvedilol to max dose. ?? Switching from HCTZ to furosemide for volume control. ? ?

## 2021-12-27 NOTE — Assessment & Plan Note (Signed)
Borderline blood pressure today.  I suspect he will probably need to go up on some medications which would probably be carvedilol.  With having edema plan is to switch from HCTZ to Lasix.  He can also stop Imdur as there is no significant CAD noted. ? ?Plan: DC HCTZ and start furosemide 20 mg daily.  For the first week he will take 40 mg daily. ?? Discontinue Imdur ?? Continue current dose of carvedilol (6.25 mg twice daily) and ramipril (10 mg daily) ?

## 2021-12-27 NOTE — Assessment & Plan Note (Signed)
Metabolic syndrome.  Discussed importance of weight loss. ?Consider adding GLP-1 agonist for weight loss and glycemic control. ?
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
## 2021-12-27 NOTE — Assessment & Plan Note (Signed)
Wearing CPAP.  This could be contributing to his edema.  We will check 2D echocardiogram to exclude pulmonary hypertension. ?

## 2021-12-27 NOTE — Assessment & Plan Note (Signed)
Still notes exertional dyspnea despite normal Coronary CTA.  [place] having some edema now and longstanding OSA, will assess EF and right-sided pressures/function with TTE. ? ? ?Plan: Check 2D echo. ?

## 2021-12-27 NOTE — Assessment & Plan Note (Signed)
No significant arrhythmia symptoms.  On stable dose of carvedilol. ?

## 2021-12-27 NOTE — Assessment & Plan Note (Signed)
He clearly has CAD, albeit not occlusive.  He is on atorvastatin.  Should be to get labs checked by PCP soon.  Would target LDL less than 70 as a goal. ?

## 2021-12-28 ENCOUNTER — Other Ambulatory Visit: Payer: Self-pay | Admitting: *Deleted

## 2021-12-28 ENCOUNTER — Telehealth: Payer: Self-pay | Admitting: Cardiology

## 2021-12-28 ENCOUNTER — Telehealth (HOSPITAL_COMMUNITY): Payer: Self-pay | Admitting: *Deleted

## 2021-12-28 ENCOUNTER — Other Ambulatory Visit: Payer: Self-pay | Admitting: Cardiology

## 2021-12-28 MED ORDER — CARVEDILOL 6.25 MG PO TABS: 6.2500 mg | ORAL_TABLET | Freq: Two times a day (BID) | ORAL | 3 refills | Status: DC

## 2021-12-28 NOTE — Telephone Encounter (Signed)
I called and spoke with the patient. ?Confirmed he is requesting coreg 6.25 mg BID [facility] [place]. ? ?Per the patient, this is correct. ?He prefers a #90 day supply. ? ?Patient aware his refill is being sent. ?He was very appreciative of the call back.  ? ?

## 2021-12-28 NOTE — Telephone Encounter (Signed)
Received call from patient's wife stating that the patient only has enough carvedilol for today and tomorrow.  She states she has called the pharmacy and was unable to get a refill. Informed her I would send a message to the Dr. Allison Quarry office.  Patient thankful for the help. ? ?Gordy Clement RN Navigator Cardiac Imaging ? Heart [facility] V[facility] Services ?[phone] Office ?[phone] Cell  ?

## 2021-12-28 NOTE — Telephone Encounter (Signed)
? ?*  STAT* If patient is at the pharmacy, call can be transferred to refill team. ? ? ?1. Which medications need to be refilled? (please list name of each medication and dose if known) carvedilol (COREG) 6.25 MG tablet ? ?2. Which pharmacy/location (including street and city if local pharmacy) is medication to be sent to? [id number] [address], [place] ? ?3. Do they need a 30 day or 90 day supply? 90 days  ? ? ?

## 2022-01-18 ENCOUNTER — Ambulatory Visit (INDEPENDENT_AMBULATORY_CARE_PROVIDER_SITE_OTHER): Payer: Medicare PPO

## 2022-01-18 ENCOUNTER — Other Ambulatory Visit: Payer: Self-pay

## 2022-01-18 DIAGNOSIS — R600 Localized edema: Secondary | ICD-10-CM

## 2022-01-18 DIAGNOSIS — R0609 Other forms of dyspnea: Secondary | ICD-10-CM

## 2022-01-18 DIAGNOSIS — G4733 Obstructive sleep apnea (adult) (pediatric): Secondary | ICD-10-CM | POA: Diagnosis not present

## 2022-01-18 DIAGNOSIS — I10 Essential (primary) hypertension: Secondary | ICD-10-CM | POA: Diagnosis not present

## 2022-01-18 DIAGNOSIS — I2510 Atherosclerotic heart disease of native coronary artery without angina pectoris: Secondary | ICD-10-CM | POA: Diagnosis not present

## 2022-01-18 DIAGNOSIS — I499 Cardiac arrhythmia, unspecified: Secondary | ICD-10-CM | POA: Diagnosis not present

## 2022-01-18 LAB — ECHOCARDIOGRAM COMPLETE
AR max vel: 1.75 cm2
AV Area VTI: 1.79 cm2
AV Area mean vel: 1.59 cm2
AV Mean grad: 8.8 mmHg
AV Peak grad: 16.2 mmHg
Ao pk vel: 2.01 m/s
S' Lateral: 3.2 cm
Single Plane A4C EF: 56.1 %

## 2022-01-21 ENCOUNTER — Telehealth: Payer: Self-pay | Admitting: Cardiology

## 2022-01-21 DIAGNOSIS — I10 Essential (primary) hypertension: Secondary | ICD-10-CM | POA: Diagnosis not present

## 2022-01-21 DIAGNOSIS — Z125 Encounter for screening for malignant neoplasm of prostate: Secondary | ICD-10-CM | POA: Diagnosis not present

## 2022-01-21 DIAGNOSIS — E782 Mixed hyperlipidemia: Secondary | ICD-10-CM | POA: Diagnosis not present

## 2022-01-21 DIAGNOSIS — E538 Deficiency of other specified B group vitamins: Secondary | ICD-10-CM | POA: Diagnosis not present

## 2022-01-21 DIAGNOSIS — E1140 Type 2 diabetes mellitus with diabetic neuropathy, unspecified: Secondary | ICD-10-CM | POA: Diagnosis not present

## 2022-01-21 NOTE — Telephone Encounter (Signed)
Leonie Man, MD  ?01/19/2022  9:42 AM EDT   ?  ?Echocardiogram results: (Somewhat technically difficult) ?Normal pump function with ejection fraction of 60 to 65% (normal range 50 to 70%) ?Grade 2 diastolic dysfunction-grade 1 is normal for age.  This means the heart does not relax well => can contribute to some shortness of breath.  Is often seen with people who have high blood pressure and/or heart artery disease. ?  ?Mild calcium buildup on the aortic valve but no change in function.  This is called sclerosis (hardening of) not stenosis (narrowing of).  This could be the cause of the potential murmur. ?  ?  ?[name], MD  ? ?

## 2022-01-21 NOTE — Telephone Encounter (Signed)
Attempted to call the patient. ?Per his wife, he is not currently home- she advised to call him back after 11:00 am. ?  ?Will try the patient back at a later time.  ?

## 2022-01-22 NOTE — Telephone Encounter (Signed)
Late entry: ?Emily Filbert, RN  ?01/21/2022  4:25 PM EDT   ?  ?The patient has been notified of the result and verbalized understanding.  All questions (if any) were answered. ?Alvis Lemmings, RN 01/21/2022 4:25 PM   ? ?

## 2022-01-24 DIAGNOSIS — G4733 Obstructive sleep apnea (adult) (pediatric): Secondary | ICD-10-CM | POA: Diagnosis not present

## 2022-01-24 DIAGNOSIS — I10 Essential (primary) hypertension: Secondary | ICD-10-CM | POA: Diagnosis not present

## 2022-01-24 DIAGNOSIS — E782 Mixed hyperlipidemia: Secondary | ICD-10-CM | POA: Diagnosis not present

## 2022-01-24 DIAGNOSIS — K219 Gastro-esophageal reflux disease without esophagitis: Secondary | ICD-10-CM | POA: Diagnosis not present

## 2022-01-24 DIAGNOSIS — Z0000 Encounter for general adult medical examination without abnormal findings: Secondary | ICD-10-CM | POA: Diagnosis not present

## 2022-01-24 DIAGNOSIS — E1140 Type 2 diabetes mellitus with diabetic neuropathy, unspecified: Secondary | ICD-10-CM | POA: Diagnosis not present

## 2022-01-24 DIAGNOSIS — I2510 Atherosclerotic heart disease of native coronary artery without angina pectoris: Secondary | ICD-10-CM | POA: Diagnosis not present

## 2022-02-14 ENCOUNTER — Ambulatory Visit: Payer: Medicare PPO | Admitting: Cardiology

## 2022-02-14 ENCOUNTER — Encounter: Payer: Self-pay | Admitting: Cardiology

## 2022-02-14 VITALS — BP 158/70 | HR 67 | Ht 68.0 in | Wt 281.1 lb

## 2022-02-14 DIAGNOSIS — Z6841 Body Mass Index (BMI) 40.0 and over, adult: Secondary | ICD-10-CM | POA: Diagnosis not present

## 2022-02-14 DIAGNOSIS — R600 Localized edema: Secondary | ICD-10-CM

## 2022-02-14 DIAGNOSIS — E785 Hyperlipidemia, unspecified: Secondary | ICD-10-CM

## 2022-02-14 DIAGNOSIS — I10 Essential (primary) hypertension: Secondary | ICD-10-CM | POA: Diagnosis not present

## 2022-02-14 DIAGNOSIS — R0609 Other forms of dyspnea: Secondary | ICD-10-CM

## 2022-02-14 DIAGNOSIS — E1169 Type 2 diabetes mellitus with other specified complication: Secondary | ICD-10-CM

## 2022-02-14 DIAGNOSIS — I2510 Atherosclerotic heart disease of native coronary artery without angina pectoris: Secondary | ICD-10-CM | POA: Diagnosis not present

## 2022-02-14 MED ORDER — FUROSEMIDE 40 MG PO TABS: 40.0000 mg | ORAL_TABLET | Freq: Every day | ORAL | 3 refills | Status: DC

## 2022-02-14 MED ORDER — CARVEDILOL 12.5 MG PO TABS: 12.5000 mg | ORAL_TABLET | Freq: Two times a day (BID) | ORAL | 3 refills | Status: DC

## 2022-02-14 NOTE — Patient Instructions (Signed)
Medication Instructions:  ?Your physician has recommended you make the following change in your medication:  ? ?- Take carvedilol (coreg) 12.'5mg'$  (2 tablets) twice daily. When you need a refill, it will just be 1 tablet twice daily.  ? ?- Take lasix (furosemide) 40 mg (2 tablets) once daily. When you need a refill, it will just be 1 tablet once daily.  ? ?*If you need a refill on your cardiac medications before your next appointment, please call your pharmacy* ? ? ?Lab Work: ?None ordered ? ?If you have labs (blood work) drawn today and your tests are completely normal, you will receive your results only by: ?MyChart Message (if you have MyChart) OR ?A paper copy in the mail ?If you have any lab test that is abnormal or we need to change your treatment, we will call you to review the results. ? ? ?Testing/Procedures: ?None ordered ? ? ?Follow-Up: ?At [facility], you and your health needs are our priority.  As part of our continuing mission to provide you with exceptional heart care, we have created designated Provider Care Teams.  These Care Teams include your primary Cardiologist (physician) and Advanced Practice Providers (APPs -  Physician Assistants and Nurse Practitioners) who all work together to provide you with the care you need, when you need it. ? ?We recommend signing up for the patient portal called "MyChart".  Sign up information is provided on this After Visit Summary.  MyChart is used to connect with patients for Virtual Visits (Telemedicine).  Patients are able to view lab/test results, encounter notes, upcoming appointments, etc.  Non-urgent messages can be sent to your provider as well.   ?To learn more about what you can do with MyChart, go to [email or web address].   ? ?Your next appointment:   ?6 month(s) ? ?The format for your next appointment:   ?In Person ? ?Provider:   ?You may see [name], MD or one of the following Advanced Practice Providers on your designated Care Team:    ?Murray Hodgkins, NP ?Christell Faith, PA-C ?Cadence Kathlen Mody, PA-C ? ? ?Other Instructions ?N/A ? ?Important Information About Sugar ? ? ? ? ?  ?
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
## 2022-02-22 ENCOUNTER — Telehealth: Payer: Self-pay | Admitting: Cardiology

## 2022-02-22 MED ORDER — FUROSEMIDE 40 MG PO TABS: 40.0000 mg | ORAL_TABLET | Freq: Every day | ORAL | 0 refills | Status: DC

## 2022-02-22 MED ORDER — CARVEDILOL 12.5 MG PO TABS: 12.5000 mg | ORAL_TABLET | Freq: Two times a day (BID) | ORAL | 0 refills | Status: DC

## 2022-02-22 NOTE — Telephone Encounter (Signed)
?*  STAT* If patient is at the pharmacy, call can be transferred to refill team. ? ? ?1. Which medications need to be refilled? (please list name of each medication and dose if known)  ?carvedilol (COREG) 12.5 MG tablet ?furosemide (LASIX) 40 MG tablet ? ?2. Which pharmacy/location (including street and city if local pharmacy) is medication to be sent to? [id number] [place] ?3. Do they need a 30 day or 90 day supply? 90 with refills ? ? ?Wife said pharmacy does not have rx with increased dosages of the medications  ?

## 2022-02-22 NOTE — Telephone Encounter (Signed)
Requested Prescriptions  ? ?Signed Prescriptions Disp Refills  ? carvedilol (COREG) 12.5 MG tablet 180 tablet 0  ?  Sig: Take 1 tablet (12.5 mg total) by mouth 2 (two) times daily with a meal.  ?  Authorizing Provider: Leonie Man  ?  Ordering User: NEWCOMER MCCLAIN, Deajah Erkkila L  ? furosemide (LASIX) 40 MG tablet 90 tablet 0  ?  Sig: Take 1 tablet (40 mg total) by mouth daily.  ?  Authorizing Provider: Leonie Man  ?  Ordering User: NEWCOMER MCCLAIN, Shantana Christon L  ? ? ?

## 2022-02-24 ENCOUNTER — Encounter: Payer: Self-pay | Admitting: Cardiology

## 2022-02-24 NOTE — Assessment & Plan Note (Signed)
Morbid obesity with enough risk factors to be criteria for metabolic syndrome including hypertension and diabetes and hyperlipidemia. ? ?Talked by the importance of dietary modification, increase exercise which she has been doing.  He just needs to watch his diet.  That seems to be his downfall. ? ?Strongly consider GLP-1 agonist-potentially in place of pioglitazone. ?

## 2022-02-24 NOTE — Assessment & Plan Note (Signed)
Blood pressure is a little high.  I do not know how much this is the effect of stopping HCTZ but he is actually taking 40 mg Lasix so that should equal out. ? ?Plan: Agree with increasing dose of carvedilol to 12.5 mg twice daily and continue ramipril 10 mg daily along with Lasix. ?

## 2022-02-24 NOTE — Assessment & Plan Note (Signed)
With normal echo, edema is probably related to venous stasis.  Recommend foot elevation, support stockings and continue current dose of Lasix.  We will simply just converted to 40 mg Lasix. ?

## 2022-02-24 NOTE — Assessment & Plan Note (Signed)
Nonocclusive by both cardiac cath and Coronary CTA. ?? He does have enough disease to warrant aspirin. ?? Currently on atorvastatin, but that is not quite at goal-consider potentially switching to rosuvastatin if levels not continue to improve. ?? He is on stable dose of ramipril r, but we are increasing his carvedilol to 12.5 mg twice daily based on PCP recommendations (and I agree) ? ?? With no true angina and we discontinued Imdur. ?

## 2022-02-24 NOTE — Assessment & Plan Note (Signed)
Probably multifactorial, but with normal coronary arteries and normal echo with may be some diastolic function, is probably more deconditioning and obesity related.  He is doing better with less dyspnea the more he is exercising. ?

## 2022-02-24 NOTE — Assessment & Plan Note (Signed)
With evidence of nonobstructive CAD, target LDL is less than 70.  Most recent result was not quite at the target goal with LDL of 83.  Currently on Lipitor 40 mg.  He does not meet goal at next check, would probably switch to rosuvastatin at same dose. ?His diabetes seems well controlled with A1c of 6.9.  Would probably recommend given his weight switching from pioglitazone to GLP-1 agonist.  With him having edema, I am concerned that there may be some component of swelling related to pioglitazone. ?

## 2022-03-04 DIAGNOSIS — N401 Enlarged prostate with lower urinary tract symptoms: Secondary | ICD-10-CM | POA: Diagnosis not present

## 2022-03-04 DIAGNOSIS — Z87442 Personal history of urinary calculi: Secondary | ICD-10-CM | POA: Diagnosis not present

## 2022-03-04 DIAGNOSIS — R351 Nocturia: Secondary | ICD-10-CM | POA: Diagnosis not present

## 2022-03-04 DIAGNOSIS — N200 Calculus of kidney: Secondary | ICD-10-CM | POA: Diagnosis not present

## 2022-03-21 DIAGNOSIS — Z6841 Body Mass Index (BMI) 40.0 and over, adult: Secondary | ICD-10-CM | POA: Diagnosis not present

## 2022-03-21 DIAGNOSIS — Z9181 History of falling: Secondary | ICD-10-CM | POA: Diagnosis not present

## 2022-03-21 DIAGNOSIS — Z0000 Encounter for general adult medical examination without abnormal findings: Secondary | ICD-10-CM | POA: Diagnosis not present

## 2022-03-21 DIAGNOSIS — E785 Hyperlipidemia, unspecified: Secondary | ICD-10-CM | POA: Diagnosis not present

## 2022-03-21 DIAGNOSIS — E669 Obesity, unspecified: Secondary | ICD-10-CM | POA: Diagnosis not present

## 2022-03-21 DIAGNOSIS — Z1331 Encounter for screening for depression: Secondary | ICD-10-CM | POA: Diagnosis not present

## 2022-04-25 DIAGNOSIS — E113393 Type 2 diabetes mellitus with moderate nonproliferative diabetic retinopathy without macular edema, bilateral: Secondary | ICD-10-CM | POA: Diagnosis not present

## 2022-05-22 ENCOUNTER — Other Ambulatory Visit: Payer: Self-pay | Admitting: Cardiology

## 2022-05-22 DIAGNOSIS — N200 Calculus of kidney: Secondary | ICD-10-CM | POA: Diagnosis not present

## 2022-05-22 DIAGNOSIS — M545 Low back pain, unspecified: Secondary | ICD-10-CM | POA: Diagnosis not present

## 2022-05-31 ENCOUNTER — Other Ambulatory Visit: Payer: Self-pay | Admitting: Cardiology

## 2022-06-03 DIAGNOSIS — M795 Residual foreign body in soft tissue: Secondary | ICD-10-CM | POA: Diagnosis not present

## 2022-07-05 DIAGNOSIS — Z6841 Body Mass Index (BMI) 40.0 and over, adult: Secondary | ICD-10-CM | POA: Diagnosis not present

## 2022-07-05 DIAGNOSIS — Z86010 Personal history of colonic polyps: Secondary | ICD-10-CM | POA: Diagnosis not present

## 2022-07-05 DIAGNOSIS — K219 Gastro-esophageal reflux disease without esophagitis: Secondary | ICD-10-CM | POA: Diagnosis not present

## 2022-07-29 DIAGNOSIS — Z79899 Other long term (current) drug therapy: Secondary | ICD-10-CM | POA: Diagnosis not present

## 2022-07-29 DIAGNOSIS — E782 Mixed hyperlipidemia: Secondary | ICD-10-CM | POA: Diagnosis not present

## 2022-07-29 DIAGNOSIS — I10 Essential (primary) hypertension: Secondary | ICD-10-CM | POA: Diagnosis not present

## 2022-07-29 DIAGNOSIS — E1140 Type 2 diabetes mellitus with diabetic neuropathy, unspecified: Secondary | ICD-10-CM | POA: Diagnosis not present

## 2022-07-29 DIAGNOSIS — E538 Deficiency of other specified B group vitamins: Secondary | ICD-10-CM | POA: Diagnosis not present

## 2022-08-02 DIAGNOSIS — K219 Gastro-esophageal reflux disease without esophagitis: Secondary | ICD-10-CM | POA: Diagnosis not present

## 2022-08-02 DIAGNOSIS — I2510 Atherosclerotic heart disease of native coronary artery without angina pectoris: Secondary | ICD-10-CM | POA: Diagnosis not present

## 2022-08-02 DIAGNOSIS — Z139 Encounter for screening, unspecified: Secondary | ICD-10-CM | POA: Diagnosis not present

## 2022-08-02 DIAGNOSIS — I10 Essential (primary) hypertension: Secondary | ICD-10-CM | POA: Diagnosis not present

## 2022-08-02 DIAGNOSIS — G4733 Obstructive sleep apnea (adult) (pediatric): Secondary | ICD-10-CM | POA: Diagnosis not present

## 2022-08-02 DIAGNOSIS — E782 Mixed hyperlipidemia: Secondary | ICD-10-CM | POA: Diagnosis not present

## 2022-08-02 DIAGNOSIS — E1140 Type 2 diabetes mellitus with diabetic neuropathy, unspecified: Secondary | ICD-10-CM | POA: Diagnosis not present

## 2022-08-07 ENCOUNTER — Encounter: Payer: Self-pay | Admitting: *Deleted

## 2022-08-15 ENCOUNTER — Ambulatory Visit: Payer: Medicare PPO | Admitting: Cardiology

## 2022-08-15 ENCOUNTER — Telehealth: Payer: Self-pay | Admitting: Cardiology

## 2022-08-15 MED ORDER — CARVEDILOL 12.5 MG PO TABS: 12.5000 mg | ORAL_TABLET | Freq: Two times a day (BID) | ORAL | 0 refills | Status: DC

## 2022-08-15 MED ORDER — ATORVASTATIN CALCIUM 40 MG PO TABS: 40.0000 mg | ORAL_TABLET | Freq: Every day | ORAL | 0 refills | Status: DC

## 2022-08-15 MED ORDER — FUROSEMIDE 40 MG PO TABS: 40.0000 mg | ORAL_TABLET | Freq: Every day | ORAL | 0 refills | Status: DC

## 2022-08-15 NOTE — Telephone Encounter (Signed)
I reviewed the patient's chart regarding Imdur.   The patient saw Dr. Ellyn Hack on 12/27/21 and per his note that day: Borderline blood pressure today.  I suspect he will probably need to go up on some medications which would probably be carvedilol.  With having edema plan is to switch from HCTZ to Lasix.  He can also stop Imdur as there is no significant CAD noted.   Plan: DC HCTZ and start furosemide 20 mg daily.  For the first week he will take 40 mg daily. Discontinue Imdur Continue current dose of carvedilol (6.25 mg twice daily) and ramipril (10 mg daily)   He was seen back in the office on 02/14/22 with Dr. Ellyn Hack and per his note that day:   Blood pressure is a little high.  I do not know how much this is the effect of stopping HCTZ but he is actually taking 40 mg Lasix so that should equal out.   Plan: Agree with increasing dose of carvedilol to 12.5 mg twice daily and continue ramipril 10 mg daily along with Lasix.  With no true angina and we discontinued Imdur.    I called and spoke with the patient's wife and advised her of the recommendations as stated above per Dr. Ellyn Hack regarding Imdur (ok per [facility]). Per Mrs. Cerda, she thought the patient had called back in after stopping Imdur because he had increased swelling and Dr. Ellyn Hack put him back on this.  I advised Mrs. Hardcastle that I have reviewed the last 2 office notes from March and April and the phone notes in between and do not see any documentation that the patient was advised to go back on Imdur.   I have also advised her that Imdur is not used for swelling, so this should not effect whether or not the patient has edema.  Mrs. Doshier inquired if the patient should stop Imdur and I advised the patient can stop Imdur at this time.  He has a follow up appointment next Friday 08/23/22 in our office and advised to keep that appointment.   Mrs. Difrancesco voices understanding of the above recommendations and is agreeable. She was  very appreciative of the call back.

## 2022-08-15 NOTE — Telephone Encounter (Signed)
*  STAT* If patient is at the pharmacy, call can be transferred to refill team.   1. Which medications need to be refilled? (please list name of each medication and dose if known)   carvedilol (COREG) 12.5 MG tablet furosemide (LASIX) 40 MG tablet atorvastatin (LIPITOR) 40 MG tablet  (1 refill left)  2. Which pharmacy/location (including street and city if local pharmacy) is medication to be sent to?  [id number] [place]. Do they need a 30 day or 90 day supply?  90 day   Wife stated patient is out of both of these medications.  Wife stated patient is also taking isosorbide mononitrate (IMDUR) 30 MG 24 hr tablet but this medication shows it was discontinued.  Wife would like a call back regarding this.

## 2022-08-15 NOTE — Telephone Encounter (Signed)
Wife stated patient is out of the medication. Requesting a refill. See below.    Wife stated patient is also taking isosorbide mononitrate (IMDUR) 30 MG 24 hr tablet but this medication shows it was discontinued.  Wife would like a call back regarding this.
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
## 2022-08-23 ENCOUNTER — Encounter: Payer: Self-pay | Admitting: Physician Assistant

## 2022-08-23 ENCOUNTER — Ambulatory Visit: Payer: Medicare PPO | Attending: Cardiology | Admitting: Physician Assistant

## 2022-08-23 VITALS — BP 130/80 | HR 62 | Ht 66.0 in | Wt 281.1 lb

## 2022-08-23 DIAGNOSIS — E785 Hyperlipidemia, unspecified: Secondary | ICD-10-CM

## 2022-08-23 DIAGNOSIS — I10 Essential (primary) hypertension: Secondary | ICD-10-CM

## 2022-08-23 DIAGNOSIS — Z6841 Body Mass Index (BMI) 40.0 and over, adult: Secondary | ICD-10-CM | POA: Diagnosis not present

## 2022-08-23 DIAGNOSIS — E1169 Type 2 diabetes mellitus with other specified complication: Secondary | ICD-10-CM | POA: Diagnosis not present

## 2022-08-23 DIAGNOSIS — G4733 Obstructive sleep apnea (adult) (pediatric): Secondary | ICD-10-CM

## 2022-08-23 DIAGNOSIS — I2510 Atherosclerotic heart disease of native coronary artery without angina pectoris: Secondary | ICD-10-CM

## 2022-08-23 DIAGNOSIS — R0609 Other forms of dyspnea: Secondary | ICD-10-CM | POA: Diagnosis not present

## 2022-08-23 DIAGNOSIS — R600 Localized edema: Secondary | ICD-10-CM | POA: Diagnosis not present

## 2022-08-23 MED ORDER — CARVEDILOL 12.5 MG PO TABS: 12.5000 mg | ORAL_TABLET | Freq: Two times a day (BID) | ORAL | 2 refills | Status: DC

## 2022-08-23 MED ORDER — ATORVASTATIN CALCIUM 80 MG PO TABS: 80.0000 mg | ORAL_TABLET | Freq: Every day | ORAL | 2 refills | Status: DC

## 2022-08-23 MED ORDER — FUROSEMIDE 40 MG PO TABS: 40.0000 mg | ORAL_TABLET | Freq: Every day | ORAL | 2 refills | Status: DC

## 2022-08-23 NOTE — Patient Instructions (Signed)
Medication Instructions:   Increase Atorvastatin 80 mg tablet by mouth daily.  *If you need a refill on your cardiac medications before your next appointment, please call your pharmacy*   Lab Work: NONE  If you have labs (blood work) drawn today and your tests are completely normal, you will receive your results only by: [place] (if you have MyChart) OR A paper copy in the mail If you have any lab test that is abnormal or we need to change your treatment, we will call you to review the results.   Testing/Procedures: NONE   Follow-Up: At [facility], you and your health needs are our priority.  As part of our continuing mission to provide you with exceptional heart care, we have created designated Provider Care Teams.  These Care Teams include your primary Cardiologist (physician) and Advanced Practice Providers (APPs -  Physician Assistants and Nurse Practitioners) who all work together to provide you with the care you need, when you need it.  We recommend signing up for the patient portal called "MyChart".  Sign up information is provided on this After Visit Summary.  MyChart is used to connect with patients for Virtual Visits (Telemedicine).  Patients are able to view lab/test results, encounter notes, upcoming appointments, etc.  Non-urgent messages can be sent to your provider as well.   To learn more about what you can do with MyChart, go to [email or web address].    Your next appointment:   6 month(s)  The format for your next appointment:   In Person  Provider:   [name], MD      Important Information About Sugar

## 2022-09-04 DIAGNOSIS — N200 Calculus of kidney: Secondary | ICD-10-CM | POA: Diagnosis not present

## 2022-09-04 DIAGNOSIS — N401 Enlarged prostate with lower urinary tract symptoms: Secondary | ICD-10-CM | POA: Diagnosis not present

## 2022-09-04 DIAGNOSIS — R351 Nocturia: Secondary | ICD-10-CM | POA: Diagnosis not present

## 2022-09-10 DIAGNOSIS — K648 Other hemorrhoids: Secondary | ICD-10-CM | POA: Diagnosis not present

## 2022-09-10 DIAGNOSIS — D120 Benign neoplasm of cecum: Secondary | ICD-10-CM | POA: Diagnosis not present

## 2022-09-10 DIAGNOSIS — D123 Benign neoplasm of transverse colon: Secondary | ICD-10-CM | POA: Diagnosis not present

## 2022-09-10 DIAGNOSIS — Z86010 Personal history of colonic polyps: Secondary | ICD-10-CM | POA: Diagnosis not present

## 2022-09-10 DIAGNOSIS — Z09 Encounter for follow-up examination after completed treatment for conditions other than malignant neoplasm: Secondary | ICD-10-CM | POA: Diagnosis not present

## 2022-09-10 DIAGNOSIS — D125 Benign neoplasm of sigmoid colon: Secondary | ICD-10-CM | POA: Diagnosis not present

## 2022-09-10 DIAGNOSIS — K6389 Other specified diseases of intestine: Secondary | ICD-10-CM | POA: Diagnosis not present

## 2022-09-10 DIAGNOSIS — D122 Benign neoplasm of ascending colon: Secondary | ICD-10-CM | POA: Diagnosis not present

## 2022-09-12 DIAGNOSIS — D123 Benign neoplasm of transverse colon: Secondary | ICD-10-CM | POA: Diagnosis not present

## 2022-09-17 DIAGNOSIS — K648 Other hemorrhoids: Secondary | ICD-10-CM | POA: Diagnosis not present

## 2022-09-17 DIAGNOSIS — Z86010 Personal history of colonic polyps: Secondary | ICD-10-CM | POA: Diagnosis not present

## 2022-09-17 DIAGNOSIS — Z09 Encounter for follow-up examination after completed treatment for conditions other than malignant neoplasm: Secondary | ICD-10-CM | POA: Diagnosis not present

## 2022-09-17 DIAGNOSIS — D120 Benign neoplasm of cecum: Secondary | ICD-10-CM | POA: Diagnosis not present

## 2022-09-17 DIAGNOSIS — D123 Benign neoplasm of transverse colon: Secondary | ICD-10-CM | POA: Diagnosis not present

## 2022-09-17 DIAGNOSIS — D122 Benign neoplasm of ascending colon: Secondary | ICD-10-CM | POA: Diagnosis not present

## 2022-09-17 DIAGNOSIS — D125 Benign neoplasm of sigmoid colon: Secondary | ICD-10-CM | POA: Diagnosis not present

## 2022-09-17 DIAGNOSIS — K6389 Other specified diseases of intestine: Secondary | ICD-10-CM | POA: Diagnosis not present

## 2022-10-24 DIAGNOSIS — E113393 Type 2 diabetes mellitus with moderate nonproliferative diabetic retinopathy without macular edema, bilateral: Secondary | ICD-10-CM | POA: Diagnosis not present

## 2022-12-18 DIAGNOSIS — M79674 Pain in right toe(s): Secondary | ICD-10-CM | POA: Diagnosis not present

## 2022-12-21 DIAGNOSIS — G4733 Obstructive sleep apnea (adult) (pediatric): Secondary | ICD-10-CM | POA: Diagnosis not present

## 2023-01-02 DIAGNOSIS — M6740 Ganglion, unspecified site: Secondary | ICD-10-CM | POA: Diagnosis not present

## 2023-01-02 DIAGNOSIS — E1142 Type 2 diabetes mellitus with diabetic polyneuropathy: Secondary | ICD-10-CM | POA: Diagnosis not present

## 2023-01-02 DIAGNOSIS — M2041 Other hammer toe(s) (acquired), right foot: Secondary | ICD-10-CM | POA: Diagnosis not present

## 2023-01-02 DIAGNOSIS — M792 Neuralgia and neuritis, unspecified: Secondary | ICD-10-CM | POA: Diagnosis not present

## 2023-01-26 ENCOUNTER — Other Ambulatory Visit: Payer: Self-pay | Admitting: Cardiology

## 2023-01-30 DIAGNOSIS — E1140 Type 2 diabetes mellitus with diabetic neuropathy, unspecified: Secondary | ICD-10-CM | POA: Diagnosis not present

## 2023-01-30 DIAGNOSIS — I10 Essential (primary) hypertension: Secondary | ICD-10-CM | POA: Diagnosis not present

## 2023-01-30 DIAGNOSIS — Z125 Encounter for screening for malignant neoplasm of prostate: Secondary | ICD-10-CM | POA: Diagnosis not present

## 2023-01-30 DIAGNOSIS — E782 Mixed hyperlipidemia: Secondary | ICD-10-CM | POA: Diagnosis not present

## 2023-01-30 DIAGNOSIS — E538 Deficiency of other specified B group vitamins: Secondary | ICD-10-CM | POA: Diagnosis not present

## 2023-02-19 DIAGNOSIS — Z6841 Body Mass Index (BMI) 40.0 and over, adult: Secondary | ICD-10-CM | POA: Diagnosis not present

## 2023-02-19 DIAGNOSIS — Z0000 Encounter for general adult medical examination without abnormal findings: Secondary | ICD-10-CM | POA: Diagnosis not present

## 2023-02-19 DIAGNOSIS — I10 Essential (primary) hypertension: Secondary | ICD-10-CM | POA: Diagnosis not present

## 2023-02-19 DIAGNOSIS — I2510 Atherosclerotic heart disease of native coronary artery without angina pectoris: Secondary | ICD-10-CM | POA: Diagnosis not present

## 2023-02-19 DIAGNOSIS — K219 Gastro-esophageal reflux disease without esophagitis: Secondary | ICD-10-CM | POA: Diagnosis not present

## 2023-02-19 DIAGNOSIS — G4733 Obstructive sleep apnea (adult) (pediatric): Secondary | ICD-10-CM | POA: Diagnosis not present

## 2023-02-19 DIAGNOSIS — E782 Mixed hyperlipidemia: Secondary | ICD-10-CM | POA: Diagnosis not present

## 2023-02-19 DIAGNOSIS — E1140 Type 2 diabetes mellitus with diabetic neuropathy, unspecified: Secondary | ICD-10-CM | POA: Diagnosis not present

## 2023-02-19 DIAGNOSIS — L309 Dermatitis, unspecified: Secondary | ICD-10-CM | POA: Diagnosis not present

## 2023-02-26 DIAGNOSIS — Z86010 Personal history of colonic polyps: Secondary | ICD-10-CM | POA: Diagnosis not present

## 2023-02-26 DIAGNOSIS — K648 Other hemorrhoids: Secondary | ICD-10-CM | POA: Diagnosis not present

## 2023-02-26 DIAGNOSIS — K6389 Other specified diseases of intestine: Secondary | ICD-10-CM | POA: Diagnosis not present

## 2023-02-26 DIAGNOSIS — D123 Benign neoplasm of transverse colon: Secondary | ICD-10-CM | POA: Diagnosis not present

## 2023-02-26 DIAGNOSIS — D125 Benign neoplasm of sigmoid colon: Secondary | ICD-10-CM | POA: Diagnosis not present

## 2023-02-26 DIAGNOSIS — D124 Benign neoplasm of descending colon: Secondary | ICD-10-CM | POA: Diagnosis not present

## 2023-02-26 DIAGNOSIS — D120 Benign neoplasm of cecum: Secondary | ICD-10-CM | POA: Diagnosis not present

## 2023-02-26 DIAGNOSIS — K644 Residual hemorrhoidal skin tags: Secondary | ICD-10-CM | POA: Diagnosis not present

## 2023-02-26 DIAGNOSIS — Z09 Encounter for follow-up examination after completed treatment for conditions other than malignant neoplasm: Secondary | ICD-10-CM | POA: Diagnosis not present

## 2023-02-26 HISTORY — PX: COLONOSCOPY: SHX174

## 2023-02-27 ENCOUNTER — Ambulatory Visit: Payer: Medicare PPO | Attending: Cardiology | Admitting: Cardiology

## 2023-02-27 ENCOUNTER — Encounter: Payer: Self-pay | Admitting: Cardiology

## 2023-02-27 VITALS — BP 140/64 | HR 59 | Ht 68.0 in | Wt 274.0 lb

## 2023-02-27 DIAGNOSIS — I2510 Atherosclerotic heart disease of native coronary artery without angina pectoris: Secondary | ICD-10-CM

## 2023-02-27 DIAGNOSIS — R600 Localized edema: Secondary | ICD-10-CM

## 2023-02-27 DIAGNOSIS — Z6841 Body Mass Index (BMI) 40.0 and over, adult: Secondary | ICD-10-CM

## 2023-02-27 DIAGNOSIS — I10 Essential (primary) hypertension: Secondary | ICD-10-CM | POA: Diagnosis not present

## 2023-02-27 DIAGNOSIS — E1169 Type 2 diabetes mellitus with other specified complication: Secondary | ICD-10-CM | POA: Diagnosis not present

## 2023-02-27 DIAGNOSIS — I499 Cardiac arrhythmia, unspecified: Secondary | ICD-10-CM

## 2023-02-27 DIAGNOSIS — E785 Hyperlipidemia, unspecified: Secondary | ICD-10-CM

## 2023-02-27 DIAGNOSIS — R0609 Other forms of dyspnea: Secondary | ICD-10-CM

## 2023-02-27 DIAGNOSIS — I25118 Atherosclerotic heart disease of native coronary artery with other forms of angina pectoris: Secondary | ICD-10-CM

## 2023-02-27 MED ORDER — SPIRONOLACTONE 25 MG PO TABS: 25.0000 mg | ORAL_TABLET | Freq: Every day | ORAL | 3 refills | Status: DC

## 2023-02-27 MED ORDER — EZETIMIBE 10 MG PO TABS: 10.0000 mg | ORAL_TABLET | Freq: Every day | ORAL | 3 refills | Status: DC

## 2023-02-27 NOTE — Patient Instructions (Signed)
Medication Instructions:  Your physician has recommended you make the following change in your medication:   START Ezetimibe (Zetia) 10 mg once daily  START Spironolactone 25 mg once daily  After one month try to decrease Furosemide (Lasix) to 1/2 tablet.   *If you need a refill on your cardiac medications before your next appointment, please call your pharmacy*   Lab Work: BMP in 2-3 weeks. No appointment is needed. Go to the [facility] entrance and check in at registration desk.   If you have labs (blood work) drawn today and your tests are completely normal, you will receive your results only by: MyChart Message (if you have MyChart) OR A paper copy in the mail If you have any lab test that is abnormal or we need to change your treatment, we will call you to review the results.   Testing/Procedures: None   Follow-Up: At [facility], you and your health needs are our priority.  As part of our continuing mission to provide you with exceptional heart care, we have created designated Provider Care Teams.  These Care Teams include your primary Cardiologist (physician) and Advanced Practice Providers (APPs -  Physician Assistants and Nurse Practitioners) who all work together to provide you with the care you need, when you need it.  We recommend signing up for the patient portal called "MyChart".  Sign up information is provided on this After Visit Summary.  MyChart is used to connect with patients for Virtual Visits (Telemedicine).  Patients are able to view lab/test results, encounter notes, upcoming appointments, etc.  Non-urgent messages can be sent to your provider as well.   To learn more about what you can do with MyChart, go to [email or web address].    Your next appointment:    Follow up in 6 months with [name] PA-C Follow up in 1 year with Dr. Herbie Baltimore  Provider:   Bryan Lemma, MD or [name], PA-C
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
## 2023-02-28 DIAGNOSIS — D125 Benign neoplasm of sigmoid colon: Secondary | ICD-10-CM | POA: Diagnosis not present

## 2023-03-01 ENCOUNTER — Encounter: Payer: Self-pay | Admitting: Cardiology

## 2023-03-01 DIAGNOSIS — I2510 Atherosclerotic heart disease of native coronary artery without angina pectoris: Secondary | ICD-10-CM | POA: Insufficient documentation

## 2023-03-01 NOTE — Assessment & Plan Note (Signed)
BP still high.  He is on max tolerable dose of carvedilol.  Heart rate 59, along with 10 mg ramipril. With him being on furosemide would not want to use HCTZ for fear of exacerbating hypokalemia.  Plan: Spironolactone-start at 12.5 mg daily for 3 weeks then assess labs.  If okay, would increase to 25 mg.  After 1 week, if edema seems to be stable, can reduce furosemide to 1/2 tablet.

## 2023-03-01 NOTE — Assessment & Plan Note (Signed)
Suspect related to venous stasis and obesity.  Continue to recommend foot elevation several times a day  Recommend support stockings if on his feet for long periods during the day. Currently on 40 mg standing dose of Lasix-we are adding spironolactone which will hopefully reduce the need for furosemide.  Will see if we can reduce down to 1/2 tablet after 1 month of being on spironolactone.

## 2023-03-01 NOTE — Assessment & Plan Note (Signed)
Controlled with carvedilol

## 2023-03-01 NOTE — Assessment & Plan Note (Signed)
Nonocclusive disease by both cardiac cath and Coronary CTA.  No active symptoms. No resting chest pain to suggest a spasm.  Plan: The disease burden does not necessarily warrant prophylactic aspirin. LDL goal should be less than 70 given other risk factors-not quite at goal on atorvastatin 80 mg.- Add Zetia 10 mg.  May need more aggressive therapy. = Nexletol versus PCSK9 inhibitor. Should have labs checked with PCP can reassess. Continue weight loss and exercise.  Dietary modifications. Continue carvedilol and ramipril Not able to titrate carvedilol further because of 59.  If additional blood pressure control we will add spironolactone-start at 12.5 img until labs checked, then will increase to 25mg 

## 2023-03-01 NOTE — Assessment & Plan Note (Signed)
As part of metabolic syndrome, important to continue losing weight.  I congratulated him on his efforts of losing 7 pounds.  Hopefully over the next several months that we will continue to pick up and he will continue to lose.  Discussed importance of alterations in both diet and exercise.

## 2023-03-06 DIAGNOSIS — R351 Nocturia: Secondary | ICD-10-CM | POA: Diagnosis not present

## 2023-03-06 DIAGNOSIS — N200 Calculus of kidney: Secondary | ICD-10-CM | POA: Diagnosis not present

## 2023-03-06 DIAGNOSIS — N401 Enlarged prostate with lower urinary tract symptoms: Secondary | ICD-10-CM | POA: Diagnosis not present

## 2023-03-17 DIAGNOSIS — N200 Calculus of kidney: Secondary | ICD-10-CM | POA: Diagnosis not present

## 2023-03-17 DIAGNOSIS — R109 Unspecified abdominal pain: Secondary | ICD-10-CM | POA: Diagnosis not present

## 2023-03-21 ENCOUNTER — Other Ambulatory Visit
Admission: RE | Admit: 2023-03-21 | Discharge: 2023-03-21 | Disposition: A | Discharge status: Home / Self Care | Payer: Medicare PPO | Attending: Cardiology | Admitting: Cardiology

## 2023-03-21 DIAGNOSIS — E1169 Type 2 diabetes mellitus with other specified complication: Secondary | ICD-10-CM | POA: Diagnosis not present

## 2023-03-21 DIAGNOSIS — E785 Hyperlipidemia, unspecified: Secondary | ICD-10-CM | POA: Diagnosis not present

## 2023-03-21 DIAGNOSIS — G4733 Obstructive sleep apnea (adult) (pediatric): Secondary | ICD-10-CM | POA: Diagnosis not present

## 2023-03-21 DIAGNOSIS — I2510 Atherosclerotic heart disease of native coronary artery without angina pectoris: Secondary | ICD-10-CM | POA: Diagnosis not present

## 2023-03-21 LAB — BASIC METABOLIC PANEL
Anion gap: 9 (ref 5–15)
BUN: 24 mg/dL — ABNORMAL HIGH (ref 8–23)
CO2: 26 mmol/L (ref 22–32)
Calcium: 8.8 mg/dL — ABNORMAL LOW (ref 8.9–10.3)
Chloride: 104 mmol/L (ref 98–111)
Creatinine, Ser: 1.47 mg/dL — ABNORMAL HIGH (ref 0.61–1.24)
GFR, Estimated: 52 mL/min — ABNORMAL LOW (ref >60)
Glucose, Bld: 147 mg/dL — ABNORMAL HIGH (ref 70–99)
Potassium: 4.1 mmol/L (ref 3.5–5.1)
Sodium: 139 mmol/L (ref 135–145)

## 2023-03-25 ENCOUNTER — Telehealth: Payer: Self-pay | Admitting: Cardiology

## 2023-03-25 MED ORDER — HYDRALAZINE HCL 25 MG PO TABS: 25.0000 mg | ORAL_TABLET | Freq: Three times a day (TID) | ORAL | 11 refills | Status: DC

## 2023-03-25 NOTE — Telephone Encounter (Signed)
The patient has been notified of the result along with recommendations.Pt verbalized understanding. All questions (if any) were answered     Marykay Lex, MD  P Cv Div [facility] Triage; Jefferey Pica, RN Interestingly, the potassium level went down, but creatinine went up after starting spironolactone.  Would stop spironolactone, increase water hydration. -> Would like to start hydralazine 25 mg 3 times a day for blood pressure.   Bryan Lemma, MD \ New Rx: Stop spironolactone Start hydralazine 25 mg 1 tab p.o. 3 times daily, dispense 90 tabs, 11 refills. => Once we can confirm if this is tolerated, we can change to 90-day supply.

## 2023-03-25 NOTE — Telephone Encounter (Signed)
Patient returned RN's call regarding test results. 

## 2023-03-31 DIAGNOSIS — M47816 Spondylosis without myelopathy or radiculopathy, lumbar region: Secondary | ICD-10-CM | POA: Diagnosis not present

## 2023-03-31 DIAGNOSIS — Z6841 Body Mass Index (BMI) 40.0 and over, adult: Secondary | ICD-10-CM | POA: Diagnosis not present

## 2023-03-31 DIAGNOSIS — Z1331 Encounter for screening for depression: Secondary | ICD-10-CM | POA: Diagnosis not present

## 2023-03-31 DIAGNOSIS — Z9181 History of falling: Secondary | ICD-10-CM | POA: Diagnosis not present

## 2023-04-24 DIAGNOSIS — E113393 Type 2 diabetes mellitus with moderate nonproliferative diabetic retinopathy without macular edema, bilateral: Secondary | ICD-10-CM | POA: Diagnosis not present

## 2023-05-19 ENCOUNTER — Other Ambulatory Visit: Payer: Self-pay | Admitting: Physician Assistant

## 2023-06-05 DIAGNOSIS — G4733 Obstructive sleep apnea (adult) (pediatric): Secondary | ICD-10-CM | POA: Diagnosis not present

## 2023-06-21 DIAGNOSIS — G4733 Obstructive sleep apnea (adult) (pediatric): Secondary | ICD-10-CM | POA: Diagnosis not present

## 2023-07-26 ENCOUNTER — Other Ambulatory Visit: Payer: Self-pay | Admitting: Physician Assistant

## 2023-07-27 ENCOUNTER — Other Ambulatory Visit: Payer: Self-pay | Admitting: Physician Assistant

## 2023-07-28 NOTE — Telephone Encounter (Signed)
[name],  On 02/27/23 Dr. Herbie Baltimore advised the patient to "try decreasing Lasix to one half tablet after about a month". There is an rx request and I'm wondering under which sig should I approve it for. The 1 tab or half.  Thank you,  Ferne Coe

## 2023-07-28 NOTE — Telephone Encounter (Signed)
last visit on 02/27/23 with plan to f/u in 6 months, please schedule.  Thanks

## 2023-07-31 ENCOUNTER — Telehealth: Payer: Self-pay | Admitting: Cardiology

## 2023-07-31 NOTE — Telephone Encounter (Signed)
Confirmed with patient is taking 1 tab QD

## 2023-07-31 NOTE — Telephone Encounter (Signed)
last visit on 02/27/23 with plan to f/u in 6 months, please schedule.  Thanks!

## 2023-07-31 NOTE — Telephone Encounter (Signed)
*  STAT* If patient is at the pharmacy, call can be transferred to refill team.   1. Which medications need to be refilled? (please list name of each medication and dose if known) atorvastatin (LIPITOR) 80 MG tablet ;  carvedilol (COREG) 12.5 MG tablet ; furosemide (LASIX) 40 MG tablet   2. Would you like to learn more about the convenience, safety, & potential cost savings by using the [facility]? No     3. Are you open to using the [facility] (Type [facility]. No ).   4. Which pharmacy/location (including street and city if local pharmacy) is medication to be sent to? [id number] - [place], [place] - [address] [facility]    5. Do they need a 30 day or 90 day supply? 90

## 2023-08-01 NOTE — Telephone Encounter (Signed)
After viewing chart, pt's medication refills have already been sent in.

## 2023-08-25 DIAGNOSIS — E113393 Type 2 diabetes mellitus with moderate nonproliferative diabetic retinopathy without macular edema, bilateral: Secondary | ICD-10-CM | POA: Diagnosis not present

## 2023-08-26 DIAGNOSIS — I10 Essential (primary) hypertension: Secondary | ICD-10-CM | POA: Diagnosis not present

## 2023-08-26 DIAGNOSIS — E1140 Type 2 diabetes mellitus with diabetic neuropathy, unspecified: Secondary | ICD-10-CM | POA: Diagnosis not present

## 2023-08-26 DIAGNOSIS — E538 Deficiency of other specified B group vitamins: Secondary | ICD-10-CM | POA: Diagnosis not present

## 2023-08-26 DIAGNOSIS — E782 Mixed hyperlipidemia: Secondary | ICD-10-CM | POA: Diagnosis not present

## 2023-08-27 DIAGNOSIS — E1140 Type 2 diabetes mellitus with diabetic neuropathy, unspecified: Secondary | ICD-10-CM | POA: Diagnosis not present

## 2023-08-27 DIAGNOSIS — I2510 Atherosclerotic heart disease of native coronary artery without angina pectoris: Secondary | ICD-10-CM | POA: Diagnosis not present

## 2023-08-27 DIAGNOSIS — Z9181 History of falling: Secondary | ICD-10-CM | POA: Diagnosis not present

## 2023-08-27 DIAGNOSIS — G4733 Obstructive sleep apnea (adult) (pediatric): Secondary | ICD-10-CM | POA: Diagnosis not present

## 2023-08-27 DIAGNOSIS — I10 Essential (primary) hypertension: Secondary | ICD-10-CM | POA: Diagnosis not present

## 2023-08-27 DIAGNOSIS — Z0000 Encounter for general adult medical examination without abnormal findings: Secondary | ICD-10-CM | POA: Diagnosis not present

## 2023-08-27 DIAGNOSIS — K219 Gastro-esophageal reflux disease without esophagitis: Secondary | ICD-10-CM | POA: Diagnosis not present

## 2023-08-27 DIAGNOSIS — E782 Mixed hyperlipidemia: Secondary | ICD-10-CM | POA: Diagnosis not present

## 2023-08-27 DIAGNOSIS — Z139 Encounter for screening, unspecified: Secondary | ICD-10-CM | POA: Diagnosis not present

## 2023-09-09 DIAGNOSIS — R351 Nocturia: Secondary | ICD-10-CM | POA: Diagnosis not present

## 2023-09-09 DIAGNOSIS — N200 Calculus of kidney: Secondary | ICD-10-CM | POA: Diagnosis not present

## 2023-09-09 DIAGNOSIS — N401 Enlarged prostate with lower urinary tract symptoms: Secondary | ICD-10-CM | POA: Diagnosis not present

## 2023-09-15 DIAGNOSIS — N200 Calculus of kidney: Secondary | ICD-10-CM | POA: Diagnosis not present

## 2023-09-16 DIAGNOSIS — N200 Calculus of kidney: Secondary | ICD-10-CM | POA: Diagnosis not present

## 2023-09-21 DIAGNOSIS — G4733 Obstructive sleep apnea (adult) (pediatric): Secondary | ICD-10-CM | POA: Diagnosis not present
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
## 2023-10-02 ENCOUNTER — Ambulatory Visit: Payer: Medicare PPO | Attending: Physician Assistant | Admitting: Physician Assistant

## 2023-10-02 ENCOUNTER — Encounter: Payer: Self-pay | Admitting: Physician Assistant

## 2023-10-02 VITALS — BP 143/62 | HR 64 | Ht 68.0 in | Wt 279.6 lb

## 2023-10-02 DIAGNOSIS — G4733 Obstructive sleep apnea (adult) (pediatric): Secondary | ICD-10-CM | POA: Diagnosis not present

## 2023-10-02 DIAGNOSIS — Z6841 Body Mass Index (BMI) 40.0 and over, adult: Secondary | ICD-10-CM

## 2023-10-02 DIAGNOSIS — I2510 Atherosclerotic heart disease of native coronary artery without angina pectoris: Secondary | ICD-10-CM

## 2023-10-02 DIAGNOSIS — R600 Localized edema: Secondary | ICD-10-CM | POA: Diagnosis not present

## 2023-10-02 DIAGNOSIS — E785 Hyperlipidemia, unspecified: Secondary | ICD-10-CM

## 2023-10-02 DIAGNOSIS — I10 Essential (primary) hypertension: Secondary | ICD-10-CM

## 2023-10-02 MED ORDER — HYDRALAZINE HCL 50 MG PO TABS: 50.0000 mg | ORAL_TABLET | Freq: Three times a day (TID) | ORAL | 9 refills | Status: DC

## 2023-10-02 NOTE — Patient Instructions (Signed)
Medication Instructions:  Your physician recommends the following medication changes.  INCREASE: Hydralazine 50 mg three times daily *If you need a refill on your cardiac medications before your next appointment, please call your pharmacy*   Lab Work: None ordered at this time    Follow-Up: At [facility], you and your health needs are our priority.  As part of our continuing mission to provide you with exceptional heart care, we have created designated Provider Care Teams.  These Care Teams include your primary Cardiologist (physician) and Advanced Practice Providers (APPs -  Physician Assistants and Nurse Practitioners) who all work together to provide you with the care you need, when you need it.  We recommend signing up for the patient portal called "MyChart".  Sign up information is provided on this After Visit Summary.  MyChart is used to connect with patients for Virtual Visits (Telemedicine).  Patients are able to view lab/test results, encounter notes, upcoming appointments, etc.  Non-urgent messages can be sent to your provider as well.   To learn more about what you can do with MyChart, go to [email or web address].    Your next appointment:   6 month(s)  Provider:   You may see Bryan Lemma, MD or one of the following Advanced Practice Providers on your designated Care Team:  [name], [place]

## 2023-10-22 ENCOUNTER — Other Ambulatory Visit: Payer: Self-pay | Admitting: Cardiology

## 2023-10-24 ENCOUNTER — Other Ambulatory Visit: Payer: Self-pay | Admitting: Cardiology

## 2023-10-28 IMAGING — CT CT HEART MORP W/ CTA COR W/ SCORE W/ CA W/CM &/OR W/O CM
1 of 13 series · 4 of 20 positions shown, 5 images · non-contrast
Comparison: CT a June 07, 2011

Addendum:
CLINICAL DATA: Chest pain

EXAM:
Cardiac/Coronary  CTA
TECHNIQUE: The patient was scanned on a Siemens Somatom go.Top scanner.

[Series 28: multiphase % cta coronary 0.60 · axial · 0.37mm/px · z∈[-1148,-1073]mm · 4 of 3792 slices shown, 5 images]
[im 759/3792  vessel]
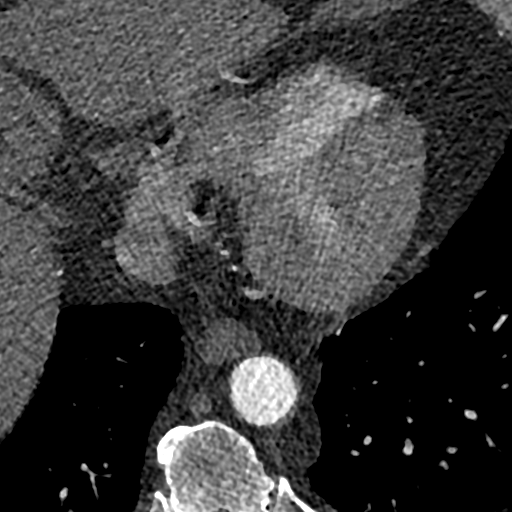
[im 759/3792  lung]
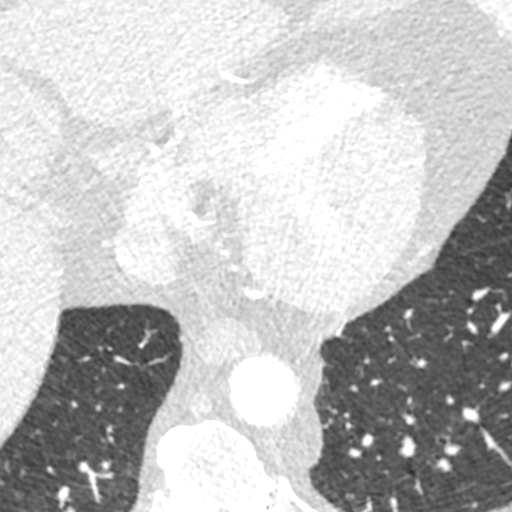
[im 1517/3792  vessel]
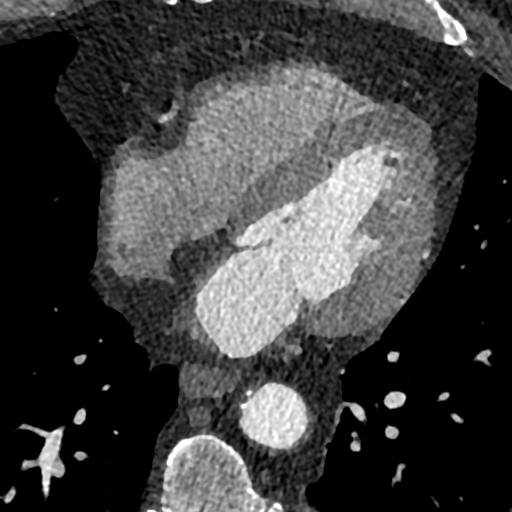
[im 2275/3792  vessel]
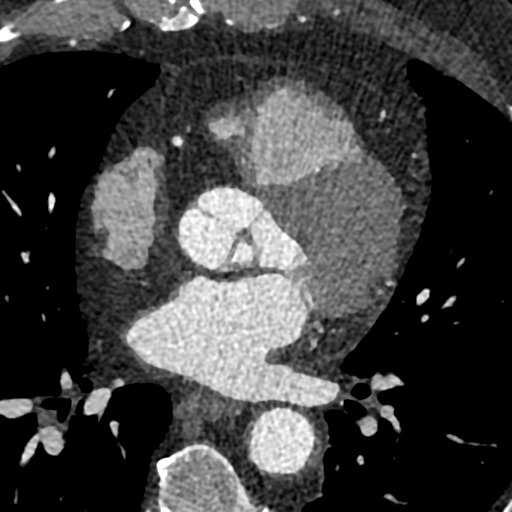
[im 3033/3792  vessel]
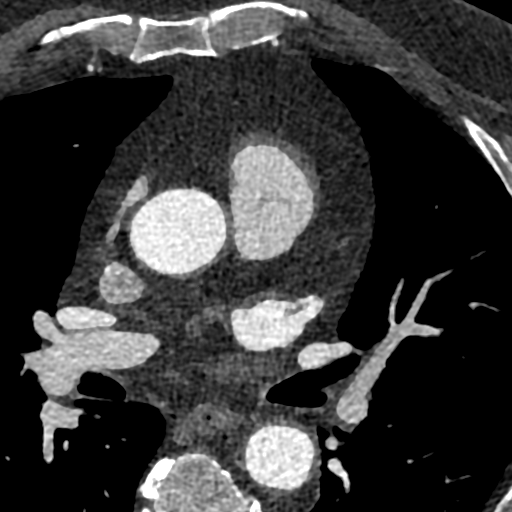

[4 of 20 positions shown; findings below may reference images not displayed]

:
A retrospective scan was triggered in the descending thoracic aorta.
Axial non-contrast 3 mm slices were carried out through the heart.
The data set was analyzed on a dedicated work station and scored
using the Agatson method. Gantry rotation speed was 330 msecs and
collimation was .6 mm. 6.25mg of carvedilol and 0.8 mg of sl NTG was
given. The 3D data set was reconstructed in 5% intervals of the
60-95 % of the R-R cycle. Diastolic phases were analyzed on a
dedicated work station using MPR, MIP and VRT modes. The patient
received 75 cc of contrast.
FINDINGS: Aorta: Normal size. Aortic root and descending aorta calcifications.
No dissection.

Aortic Valve:  Trileaflet.  mild calcifications.

Coronary Arteries:  Normal coronary origin.  Right dominance.

RCA is a dominant artery that gives rise to PDA and PLA. There is
calcified plaque proximally causing minimal stenosis (<25%).

Left main is a large artery that gives rise to LAD and LCX arteries.
LM has no disease

LAD has calcified plaque in the proximal LAD causing moderate
stenosis (50%).

LCX is a non-dominant artery that gives rise to two branches. There
is calcified plaque in the proximal OM1 causing mild stenosis (25%).

Other findings:

Normal pulmonary vein drainage into the left atrium.

Normal left atrial appendage without a thrombus.

Normal size of the pulmonary artery.
IMPRESSION: 1. Coronary calcium score of 460. This was 77th percentile for age
and sex matched control.

2. Normal coronary origin with right dominance.

3. Calcified plaque causing moderate stenosis in the proximal LAD

4. Mild stenosis in the proximal OM1

5. CAD-RADS 3. Moderate stenosis. Consider preventive therapy and
risk factor modification.

6. Additional analysis with CT FFR will be submitted and reported
separately.

ADDENDUM:
The following report is an over-read performed by radiologist Dr.
over-read does not include interpretation of cardiac or coronary
anatomy or pathology. The coronary calcium score/coronary CTA
interpretation by the cardiologist is attached.
FINDINGS: Vascular: Aortic atherosclerosis without aneurysmal dilation. No
central pulmonary embolus.

Mediastinum/Nodes: No mediastinal or hilar adenopathy. Esophagus is
unremarkable.

Lungs/Pleura: No suspicious pulmonary nodules or masses. Calcified
right middle lobe granuloma. No focal airspace consolidation. No
pleural effusion. No pneumothorax.

Upper Abdomen: No acute abnormality on this limited examination.

Musculoskeletal: No acute osseous finding.  Thoracic spondylosis.
IMPRESSION: 1. No suspicious pulmonary nodules or masses.
2. No acute finding.
3. Aortic Atherosclerosis ([id number]).

*** End of Addendum ***
:
A retrospective scan was triggered in the descending thoracic aorta.
Axial non-contrast 3 mm slices were carried out through the heart.
The data set was analyzed on a dedicated work station and scored
using the Agatson method. Gantry rotation speed was 330 msecs and
collimation was .6 mm. 6.25mg of carvedilol and 0.8 mg of sl NTG was
given. The 3D data set was reconstructed in 5% intervals of the
60-95 % of the R-R cycle. Diastolic phases were analyzed on a
dedicated work station using MPR, MIP and VRT modes. The patient
received 75 cc of contrast.
FINDINGS: Aorta: Normal size. Aortic root and descending aorta calcifications.
No dissection.

Aortic Valve:  Trileaflet.  mild calcifications.

Coronary Arteries:  Normal coronary origin.  Right dominance.

RCA is a dominant artery that gives rise to PDA and PLA. There is
calcified plaque proximally causing minimal stenosis (<25%).

Left main is a large artery that gives rise to LAD and LCX arteries.
LM has no disease

LAD has calcified plaque in the proximal LAD causing moderate
stenosis (50%).

LCX is a non-dominant artery that gives rise to two branches. There
is calcified plaque in the proximal OM1 causing mild stenosis (25%).

Other findings:

Normal pulmonary vein drainage into the left atrium.

Normal left atrial appendage without a thrombus.

Normal size of the pulmonary artery.
IMPRESSION: 1. Coronary calcium score of 460. This was 77th percentile for age
and sex matched control.

2. Normal coronary origin with right dominance.

3. Calcified plaque causing moderate stenosis in the proximal LAD

4. Mild stenosis in the proximal OM1

5. CAD-RADS 3. Moderate stenosis. Consider preventive therapy and
risk factor modification.

6. Additional analysis with CT FFR will be submitted and reported
separately.

## 2023-11-12 DIAGNOSIS — I10 Essential (primary) hypertension: Secondary | ICD-10-CM | POA: Diagnosis not present

## 2023-11-12 DIAGNOSIS — M47816 Spondylosis without myelopathy or radiculopathy, lumbar region: Secondary | ICD-10-CM | POA: Diagnosis not present

## 2023-12-14 ENCOUNTER — Other Ambulatory Visit: Payer: Self-pay | Admitting: Cardiology

## 2023-12-22 DIAGNOSIS — G4733 Obstructive sleep apnea (adult) (pediatric): Secondary | ICD-10-CM | POA: Diagnosis not present

## 2024-02-03 DIAGNOSIS — N401 Enlarged prostate with lower urinary tract symptoms: Secondary | ICD-10-CM | POA: Diagnosis not present

## 2024-02-03 DIAGNOSIS — N200 Calculus of kidney: Secondary | ICD-10-CM | POA: Diagnosis not present

## 2024-02-03 DIAGNOSIS — R351 Nocturia: Secondary | ICD-10-CM | POA: Diagnosis not present

## 2024-02-06 DIAGNOSIS — N200 Calculus of kidney: Secondary | ICD-10-CM | POA: Diagnosis not present

## 2024-02-10 ENCOUNTER — Encounter: Payer: Self-pay | Admitting: Physician Assistant

## 2024-02-10 ENCOUNTER — Ambulatory Visit: Attending: Physician Assistant | Admitting: Physician Assistant

## 2024-02-10 VITALS — BP 144/60 | HR 75 | Ht 69.0 in | Wt 280.6 lb

## 2024-02-10 DIAGNOSIS — Z6841 Body Mass Index (BMI) 40.0 and over, adult: Secondary | ICD-10-CM | POA: Diagnosis not present

## 2024-02-10 DIAGNOSIS — I2510 Atherosclerotic heart disease of native coronary artery without angina pectoris: Secondary | ICD-10-CM

## 2024-02-10 DIAGNOSIS — R600 Localized edema: Secondary | ICD-10-CM

## 2024-02-10 DIAGNOSIS — G4733 Obstructive sleep apnea (adult) (pediatric): Secondary | ICD-10-CM | POA: Diagnosis not present

## 2024-02-10 DIAGNOSIS — I10 Essential (primary) hypertension: Secondary | ICD-10-CM | POA: Diagnosis not present

## 2024-02-10 DIAGNOSIS — E785 Hyperlipidemia, unspecified: Secondary | ICD-10-CM | POA: Diagnosis not present

## 2024-02-10 MED ORDER — CARVEDILOL 12.5 MG PO TABS: 18.7500 mg | ORAL_TABLET | Freq: Two times a day (BID) | ORAL | 3 refills | Status: AC
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
## 2024-02-10 NOTE — Patient Instructions (Addendum)
 Medication Instructions:  Your physician recommends the following medication changes.  INCREASE: Coreg 18.75 mg twice daily   *If you need a refill on your cardiac medications before your next appointment, please call your pharmacy*  Lab Work: None ordered at this time  If you have labs (blood work) drawn today and your tests are completely normal, you will receive your results only by: MyChart Message (if you have MyChart) OR A paper copy in the mail If you have any lab test that is abnormal or we need to change your treatment, we will call you to review the results.  Follow-Up: At [facility], you and your health needs are our priority.  As part of our continuing mission to provide you with exceptional heart care, our providers are all part of one team.  This team includes your primary Cardiologist (physician) and Advanced Practice Providers or APPs (Physician Assistants and Nurse Practitioners) who all work together to provide you with the care you need, when you need it.  Your next appointment:   3 month(s)  Provider:   You may see Varney Gentleman, PA-C

## 2024-02-23 DIAGNOSIS — Z125 Encounter for screening for malignant neoplasm of prostate: Secondary | ICD-10-CM | POA: Diagnosis not present

## 2024-02-23 DIAGNOSIS — Z79899 Other long term (current) drug therapy: Secondary | ICD-10-CM | POA: Diagnosis not present

## 2024-02-23 DIAGNOSIS — E113393 Type 2 diabetes mellitus with moderate nonproliferative diabetic retinopathy without macular edema, bilateral: Secondary | ICD-10-CM | POA: Diagnosis not present

## 2024-02-23 DIAGNOSIS — E538 Deficiency of other specified B group vitamins: Secondary | ICD-10-CM | POA: Diagnosis not present

## 2024-02-23 DIAGNOSIS — E782 Mixed hyperlipidemia: Secondary | ICD-10-CM | POA: Diagnosis not present

## 2024-02-23 DIAGNOSIS — E1140 Type 2 diabetes mellitus with diabetic neuropathy, unspecified: Secondary | ICD-10-CM | POA: Diagnosis not present

## 2024-02-23 DIAGNOSIS — I10 Essential (primary) hypertension: Secondary | ICD-10-CM | POA: Diagnosis not present

## 2024-03-03 DIAGNOSIS — E782 Mixed hyperlipidemia: Secondary | ICD-10-CM | POA: Diagnosis not present

## 2024-03-03 DIAGNOSIS — I10 Essential (primary) hypertension: Secondary | ICD-10-CM | POA: Diagnosis not present

## 2024-03-03 DIAGNOSIS — I2510 Atherosclerotic heart disease of native coronary artery without angina pectoris: Secondary | ICD-10-CM | POA: Diagnosis not present

## 2024-03-03 DIAGNOSIS — K219 Gastro-esophageal reflux disease without esophagitis: Secondary | ICD-10-CM | POA: Diagnosis not present

## 2024-03-03 DIAGNOSIS — G4733 Obstructive sleep apnea (adult) (pediatric): Secondary | ICD-10-CM | POA: Diagnosis not present

## 2024-03-03 DIAGNOSIS — Z0000 Encounter for general adult medical examination without abnormal findings: Secondary | ICD-10-CM | POA: Diagnosis not present

## 2024-03-03 DIAGNOSIS — E1140 Type 2 diabetes mellitus with diabetic neuropathy, unspecified: Secondary | ICD-10-CM | POA: Diagnosis not present

## 2024-03-11 ENCOUNTER — Other Ambulatory Visit: Payer: Self-pay | Admitting: Cardiology

## 2024-03-20 DIAGNOSIS — G4733 Obstructive sleep apnea (adult) (pediatric): Secondary | ICD-10-CM | POA: Diagnosis not present
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
## 2024-05-11 ENCOUNTER — Ambulatory Visit: Attending: Physician Assistant | Admitting: Physician Assistant

## 2024-05-11 ENCOUNTER — Encounter: Payer: Self-pay | Admitting: Physician Assistant

## 2024-05-11 VITALS — BP 130/60 | HR 71 | Ht 69.0 in | Wt 285.4 lb

## 2024-05-11 DIAGNOSIS — Z6841 Body Mass Index (BMI) 40.0 and over, adult: Secondary | ICD-10-CM | POA: Diagnosis not present

## 2024-05-11 DIAGNOSIS — G4733 Obstructive sleep apnea (adult) (pediatric): Secondary | ICD-10-CM

## 2024-05-11 DIAGNOSIS — I10 Essential (primary) hypertension: Secondary | ICD-10-CM

## 2024-05-11 DIAGNOSIS — E785 Hyperlipidemia, unspecified: Secondary | ICD-10-CM | POA: Diagnosis not present

## 2024-05-11 DIAGNOSIS — I2510 Atherosclerotic heart disease of native coronary artery without angina pectoris: Secondary | ICD-10-CM

## 2024-05-11 DIAGNOSIS — R600 Localized edema: Secondary | ICD-10-CM | POA: Diagnosis not present

## 2024-05-11 NOTE — Patient Instructions (Signed)
 Medication Instructions:  Your physician recommends the following medication changes.  INCREASE: Lasix  to 40 mg twice daily for 3 days then resume the 40 mg once daily   *If you need a refill on your cardiac medications before your next appointment, please call your pharmacy*  Lab Work: None ordered at this time   Testing/Procedures: Your physician has requested that you have an echocardiogram. Echocardiography is a painless test that uses sound waves to create images of your heart. It provides your doctor with information about the size and shape of your heart and how well your heart's chambers and valves are working.   You may receive an ultrasound enhancing agent through an IV if needed to better visualize your heart during the echo. This procedure takes approximately one hour.  There are no restrictions for this procedure.  This will take place at [address] [facility]) [address], [place] [address]  Please note: We ask at that you not bring children with you during ultrasound (echo/ vascular) testing. Due to room size and safety concerns, children are not allowed in the ultrasound rooms during exams. Our front office staff cannot provide observation of children in our lobby area while testing is being conducted. An adult accompanying a patient to their appointment will only be allowed in the ultrasound room at the discretion of the ultrasound technician under special circumstances. We apologize for any inconvenience.   Follow-Up: At [facility], you and your health needs are our priority.  As part of our continuing mission to provide you with exceptional heart care, our providers are all part of one team.  This team includes your primary Cardiologist (physician) and Advanced Practice Providers or APPs (Physician Assistants and Nurse Practitioners) who all work together to provide you with the care you need, when you need it.  Your next appointment:   After the  Echocardiogram  Provider:   You may see Alm Clay, MD or Bernardino Bring, PA-C  We recommend signing up for the patient portal called MyChart.  Sign up information is provided on this After Visit Summary.  MyChart is used to connect with patients for Virtual Visits (Telemedicine).  Patients are able to view lab/test results, encounter notes, upcoming appointments, etc.  Non-urgent messages can be sent to your provider as well.   To learn more about what you can do with MyChart, go to [email or web address].   Other Instructions Please buy and use compression stockings on your lower legs that are 15 mmHg rating.

## 2024-06-09 ENCOUNTER — Other Ambulatory Visit: Payer: Self-pay | Admitting: Physician Assistant

## 2024-06-16 DIAGNOSIS — M5416 Radiculopathy, lumbar region: Secondary | ICD-10-CM | POA: Diagnosis not present

## 2024-06-16 DIAGNOSIS — L989 Disorder of the skin and subcutaneous tissue, unspecified: Secondary | ICD-10-CM | POA: Diagnosis not present

## 2024-06-17 DIAGNOSIS — L989 Disorder of the skin and subcutaneous tissue, unspecified: Secondary | ICD-10-CM | POA: Diagnosis not present

## 2024-06-20 DIAGNOSIS — G4733 Obstructive sleep apnea (adult) (pediatric): Secondary | ICD-10-CM | POA: Diagnosis not present

## 2024-06-21 ENCOUNTER — Other Ambulatory Visit

## 2024-06-21 ENCOUNTER — Ambulatory Visit: Attending: Physician Assistant

## 2024-06-21 DIAGNOSIS — I2510 Atherosclerotic heart disease of native coronary artery without angina pectoris: Secondary | ICD-10-CM

## 2024-06-21 DIAGNOSIS — R600 Localized edema: Secondary | ICD-10-CM | POA: Diagnosis not present

## 2024-06-21 LAB — ECHOCARDIOGRAM COMPLETE
AR max vel: 1.72 cm2
AV Area VTI: 1.8 cm2
AV Area mean vel: 1.83 cm2
AV Mean grad: 15 mmHg
AV Peak grad: 29.2 mmHg
Ao pk vel: 2.7 m/s
Area-P 1/2: 3.77 cm2
S' Lateral: 3.64 cm

## 2024-06-22 ENCOUNTER — Encounter: Payer: Self-pay | Admitting: Physician Assistant

## 2024-06-22 ENCOUNTER — Ambulatory Visit: Payer: Self-pay | Admitting: Physician Assistant

## 2024-06-22 ENCOUNTER — Ambulatory Visit: Attending: Physician Assistant | Admitting: Physician Assistant

## 2024-06-22 VITALS — BP 146/70 | HR 62 | Ht 66.0 in | Wt 283.0 lb

## 2024-06-22 DIAGNOSIS — R600 Localized edema: Secondary | ICD-10-CM

## 2024-06-22 DIAGNOSIS — G4733 Obstructive sleep apnea (adult) (pediatric): Secondary | ICD-10-CM

## 2024-06-22 DIAGNOSIS — Z6841 Body Mass Index (BMI) 40.0 and over, adult: Secondary | ICD-10-CM

## 2024-06-22 DIAGNOSIS — I10 Essential (primary) hypertension: Secondary | ICD-10-CM

## 2024-06-22 DIAGNOSIS — E785 Hyperlipidemia, unspecified: Secondary | ICD-10-CM | POA: Diagnosis not present

## 2024-06-22 DIAGNOSIS — I350 Nonrheumatic aortic (valve) stenosis: Secondary | ICD-10-CM

## 2024-06-22 DIAGNOSIS — I2510 Atherosclerotic heart disease of native coronary artery without angina pectoris: Secondary | ICD-10-CM | POA: Diagnosis not present

## 2024-06-22 NOTE — Patient Instructions (Signed)
 Medication Instructions:  Your physician recommends that you continue on your current medications as directed. Please refer to the Current Medication list given to you today.   *If you need a refill on your cardiac medications before your next appointment, please call your pharmacy*  Lab Work: None ordered at this time   Follow-Up: At [facility], you and your health needs are our priority.  As part of our continuing mission to provide you with exceptional heart care, our providers are all part of one team.  This team includes your primary Cardiologist (physician) and Advanced Practice Providers or APPs (Physician Assistants and Nurse Practitioners) who all work together to provide you with the care you need, when you need it.  Your next appointment:   6 month(s)  Provider:   You may see Alm Clay, MD or Bernardino Bring, PA-C  We recommend signing up for the patient portal called MyChart.  Sign up information is provided on this After Visit Summary.  MyChart is used to connect with patients for Virtual Visits (Telemedicine).  Patients are able to view lab/test results, encounter notes, upcoming appointments, etc.  Non-urgent messages can be sent to your provider as well.   To learn more about what you can do with MyChart, go to [email or web address].
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
## 2024-06-25 DIAGNOSIS — C44519 Basal cell carcinoma of skin of other part of trunk: Secondary | ICD-10-CM | POA: Diagnosis not present

## 2024-06-25 DIAGNOSIS — D045 Carcinoma in situ of skin of trunk: Secondary | ICD-10-CM | POA: Diagnosis not present

## 2024-07-20 DIAGNOSIS — D045 Carcinoma in situ of skin of trunk: Secondary | ICD-10-CM | POA: Diagnosis not present

## 2024-07-20 DIAGNOSIS — L905 Scar conditions and fibrosis of skin: Secondary | ICD-10-CM | POA: Diagnosis not present

## 2024-08-05 DIAGNOSIS — R351 Nocturia: Secondary | ICD-10-CM | POA: Diagnosis not present

## 2024-08-05 DIAGNOSIS — N200 Calculus of kidney: Secondary | ICD-10-CM | POA: Diagnosis not present

## 2024-08-05 DIAGNOSIS — N401 Enlarged prostate with lower urinary tract symptoms: Secondary | ICD-10-CM | POA: Diagnosis not present

## 2024-08-19 DIAGNOSIS — E113393 Type 2 diabetes mellitus with moderate nonproliferative diabetic retinopathy without macular edema, bilateral: Secondary | ICD-10-CM | POA: Diagnosis not present

## 2024-09-04 ENCOUNTER — Other Ambulatory Visit: Payer: Self-pay | Admitting: Physician Assistant

## 2024-09-08 DIAGNOSIS — E538 Deficiency of other specified B group vitamins: Secondary | ICD-10-CM | POA: Diagnosis not present

## 2024-09-08 DIAGNOSIS — E782 Mixed hyperlipidemia: Secondary | ICD-10-CM | POA: Diagnosis not present

## 2024-09-08 DIAGNOSIS — E1140 Type 2 diabetes mellitus with diabetic neuropathy, unspecified: Secondary | ICD-10-CM | POA: Diagnosis not present

## 2024-09-08 DIAGNOSIS — I10 Essential (primary) hypertension: Secondary | ICD-10-CM | POA: Diagnosis not present

## 2024-09-10 DIAGNOSIS — K219 Gastro-esophageal reflux disease without esophagitis: Secondary | ICD-10-CM | POA: Diagnosis not present

## 2024-09-10 DIAGNOSIS — G4733 Obstructive sleep apnea (adult) (pediatric): Secondary | ICD-10-CM | POA: Diagnosis not present

## 2024-09-10 DIAGNOSIS — E1140 Type 2 diabetes mellitus with diabetic neuropathy, unspecified: Secondary | ICD-10-CM | POA: Diagnosis not present

## 2024-09-10 DIAGNOSIS — Z9181 History of falling: Secondary | ICD-10-CM | POA: Diagnosis not present

## 2024-09-10 DIAGNOSIS — E782 Mixed hyperlipidemia: Secondary | ICD-10-CM | POA: Diagnosis not present

## 2024-09-10 DIAGNOSIS — I2510 Atherosclerotic heart disease of native coronary artery without angina pectoris: Secondary | ICD-10-CM | POA: Diagnosis not present

## 2024-09-10 DIAGNOSIS — Z1331 Encounter for screening for depression: Secondary | ICD-10-CM | POA: Diagnosis not present

## 2024-09-10 DIAGNOSIS — I10 Essential (primary) hypertension: Secondary | ICD-10-CM | POA: Diagnosis not present

## 2024-09-17 ENCOUNTER — Other Ambulatory Visit: Payer: Self-pay | Admitting: Cardiology

## 2024-12-01 ENCOUNTER — Other Ambulatory Visit: Payer: Self-pay | Admitting: Cardiology
